# Patient Record
Sex: Male | Born: 1960 | Race: Black or African American | Hispanic: No | Marital: Married | Smoking: Never smoker
Health system: Southern US, Community
[De-identification: ages and names within clinical notes are randomized; demographics above are authoritative.]

## PROBLEM LIST (undated history)

## (undated) DIAGNOSIS — E785 Hyperlipidemia, unspecified: Secondary | ICD-10-CM

## (undated) DIAGNOSIS — K219 Gastro-esophageal reflux disease without esophagitis: Secondary | ICD-10-CM

## (undated) DIAGNOSIS — B1910 Unspecified viral hepatitis B without hepatic coma: Secondary | ICD-10-CM

## (undated) DIAGNOSIS — I10 Essential (primary) hypertension: Secondary | ICD-10-CM

## (undated) HISTORY — DX: Unspecified viral hepatitis B without hepatic coma: B19.10

---

## 1998-09-19 ENCOUNTER — Ambulatory Visit (HOSPITAL_COMMUNITY): Admission: RE | Admit: 1998-09-19 | Discharge: 1998-09-19 | Payer: Self-pay | Admitting: Internal Medicine

## 1998-09-19 ENCOUNTER — Encounter: Payer: Self-pay | Admitting: Internal Medicine

## 2005-02-22 ENCOUNTER — Encounter: Admission: RE | Admit: 2005-02-22 | Discharge: 2005-02-22 | Payer: Self-pay | Admitting: Occupational Medicine

## 2006-04-09 ENCOUNTER — Emergency Department (HOSPITAL_COMMUNITY): Admission: EM | Admit: 2006-04-09 | Discharge: 2006-04-09 | Payer: Self-pay | Admitting: Emergency Medicine

## 2007-09-15 ENCOUNTER — Encounter: Admission: RE | Admit: 2007-09-15 | Discharge: 2007-09-15 | Payer: Self-pay | Admitting: Internal Medicine

## 2009-04-29 ENCOUNTER — Emergency Department (HOSPITAL_COMMUNITY): Admission: EM | Admit: 2009-04-29 | Discharge: 2009-04-29 | Payer: Self-pay | Admitting: Family Medicine

## 2009-07-14 ENCOUNTER — Ambulatory Visit: Payer: Self-pay | Admitting: Internal Medicine

## 2009-07-28 ENCOUNTER — Encounter (INDEPENDENT_AMBULATORY_CARE_PROVIDER_SITE_OTHER): Payer: Self-pay | Admitting: Family Medicine

## 2009-07-28 ENCOUNTER — Ambulatory Visit: Payer: Self-pay | Admitting: Internal Medicine

## 2009-07-28 LAB — CONVERTED CEMR LAB
ALT: 23 units/L (ref 0–53)
AST: 23 units/L (ref 0–37)
Albumin: 4.5 g/dL (ref 3.5–5.2)
Alkaline Phosphatase: 78 units/L (ref 39–117)
BUN: 11 mg/dL (ref 6–23)
Basophils Absolute: 0 10*3/uL (ref 0.0–0.1)
Basophils Relative: 1 % (ref 0–1)
CO2: 24 meq/L (ref 19–32)
Calcium: 9.7 mg/dL (ref 8.4–10.5)
Chloride: 102 meq/L (ref 96–112)
Cholesterol: 233 mg/dL — ABNORMAL HIGH (ref 0–200)
Creatinine, Ser: 1 mg/dL (ref 0.40–1.50)
Eosinophils Absolute: 0 10*3/uL (ref 0.0–0.7)
Eosinophils Relative: 1 % (ref 0–5)
Glucose, Bld: 84 mg/dL (ref 70–99)
HCT: 47.5 % (ref 39.0–52.0)
HDL: 81 mg/dL (ref >39)
Hemoglobin: 15.3 g/dL (ref 13.0–17.0)
LDL Cholesterol: 136 mg/dL — ABNORMAL HIGH (ref 0–99)
Lymphocytes Relative: 36 % (ref 12–46)
Lymphs Abs: 1.1 10*3/uL (ref 0.7–4.0)
MCHC: 32.2 g/dL (ref 30.0–36.0)
MCV: 91.5 fL (ref 78.0–100.0)
Monocytes Absolute: 0.4 10*3/uL (ref 0.1–1.0)
Monocytes Relative: 11 % (ref 3–12)
Neutro Abs: 1.6 10*3/uL — ABNORMAL LOW (ref 1.7–7.7)
Neutrophils Relative %: 51 % (ref 43–77)
Platelets: 292 10*3/uL (ref 150–400)
Potassium: 4.2 meq/L (ref 3.5–5.3)
RBC: 5.19 M/uL (ref 4.22–5.81)
RDW: 12.5 % (ref 11.5–15.5)
Sodium: 140 meq/L (ref 135–145)
Total Bilirubin: 0.4 mg/dL (ref 0.3–1.2)
Total CHOL/HDL Ratio: 2.9
Total Protein: 7.8 g/dL (ref 6.0–8.3)
Triglycerides: 79 mg/dL (ref <150)
VLDL: 16 mg/dL (ref 0–40)
Vit D, 25-Hydroxy: 15 ng/mL — ABNORMAL LOW (ref 30–89)
WBC: 3.2 10*3/uL — ABNORMAL LOW (ref 4.0–10.5)

## 2009-08-04 ENCOUNTER — Ambulatory Visit: Payer: Self-pay | Admitting: Internal Medicine

## 2009-09-06 ENCOUNTER — Ambulatory Visit: Payer: Self-pay | Admitting: Family Medicine

## 2009-09-06 ENCOUNTER — Encounter (INDEPENDENT_AMBULATORY_CARE_PROVIDER_SITE_OTHER): Payer: Self-pay | Admitting: Family Medicine

## 2009-09-06 LAB — CONVERTED CEMR LAB
Anti Nuclear Antibody(ANA): NEGATIVE
Basophils Absolute: 0 10*3/uL (ref 0.0–0.1)
Basophils Relative: 0 % (ref 0–1)
CRP: 0.1 mg/dL (ref <0.6)
Eosinophils Absolute: 0.1 10*3/uL (ref 0.0–0.7)
Eosinophils Relative: 1 % (ref 0–5)
HCT: 46.5 % (ref 39.0–52.0)
Hemoglobin: 15.4 g/dL (ref 13.0–17.0)
Lymphocytes Relative: 28 % (ref 12–46)
Lymphs Abs: 1 10*3/uL (ref 0.7–4.0)
MCHC: 33.1 g/dL (ref 30.0–36.0)
MCV: 87.9 fL (ref 78.0–100.0)
Monocytes Absolute: 0.3 10*3/uL (ref 0.1–1.0)
Monocytes Relative: 9 % (ref 3–12)
Neutro Abs: 2.2 10*3/uL (ref 1.7–7.7)
Neutrophils Relative %: 61 % (ref 43–77)
Platelets: 172 10*3/uL (ref 150–400)
RBC: 5.29 M/uL (ref 4.22–5.81)
RDW: 12.6 % (ref 11.5–15.5)
Rheumatoid fact SerPl-aCnc: <20 intl units/mL (ref 0–20)
Sed Rate: 4 mm/hr (ref 0–16)
WBC: 3.6 10*3/uL — ABNORMAL LOW (ref 4.0–10.5)

## 2009-09-14 ENCOUNTER — Encounter: Admission: RE | Admit: 2009-09-14 | Discharge: 2009-10-11 | Payer: Self-pay | Admitting: Family Medicine

## 2009-09-18 ENCOUNTER — Emergency Department (HOSPITAL_COMMUNITY): Admission: EM | Admit: 2009-09-18 | Discharge: 2009-09-18 | Payer: Self-pay | Admitting: Emergency Medicine

## 2009-11-09 ENCOUNTER — Emergency Department (HOSPITAL_COMMUNITY): Admission: EM | Admit: 2009-11-09 | Discharge: 2009-11-09 | Payer: Self-pay | Admitting: Emergency Medicine

## 2010-04-13 ENCOUNTER — Emergency Department (HOSPITAL_COMMUNITY)
Admission: EM | Admit: 2010-04-13 | Discharge: 2010-04-13 | Payer: Self-pay | Source: Home / Self Care | Admitting: Emergency Medicine

## 2010-08-01 LAB — POCT CARDIAC MARKERS
CKMB, poc: <1 ng/mL — ABNORMAL LOW (ref 1.0–8.0)
Myoglobin, poc: 46.3 ng/mL (ref 12–200)
Troponin i, poc: <0.05 ng/mL (ref 0.00–0.09)

## 2010-08-06 LAB — POCT CARDIAC MARKERS
CKMB, poc: <1 ng/mL — ABNORMAL LOW (ref 1.0–8.0)
Myoglobin, poc: 60.8 ng/mL (ref 12–200)
Troponin i, poc: <0.05 ng/mL (ref 0.00–0.09)

## 2010-08-07 ENCOUNTER — Emergency Department (HOSPITAL_COMMUNITY)
Admission: EM | Admit: 2010-08-07 | Discharge: 2010-08-07 | Disposition: A | Discharge status: Home / Self Care | Payer: Self-pay | Attending: Emergency Medicine | Admitting: Emergency Medicine

## 2010-08-07 DIAGNOSIS — M549 Dorsalgia, unspecified: Secondary | ICD-10-CM | POA: Insufficient documentation

## 2010-08-08 LAB — CBC
HCT: 46.3 % (ref 39.0–52.0)
Hemoglobin: 15.5 g/dL (ref 13.0–17.0)
MCHC: 33.4 g/dL (ref 30.0–36.0)
MCV: 89.5 fL (ref 78.0–100.0)
Platelets: 264 10*3/uL (ref 150–400)
RBC: 5.18 MIL/uL (ref 4.22–5.81)
RDW: 13.5 % (ref 11.5–15.5)
WBC: 3.6 10*3/uL — ABNORMAL LOW (ref 4.0–10.5)

## 2010-08-08 LAB — DIFFERENTIAL
Basophils Absolute: 0 10*3/uL (ref 0.0–0.1)
Basophils Relative: 0 % (ref 0–1)
Eosinophils Absolute: 0.1 10*3/uL (ref 0.0–0.7)
Eosinophils Relative: 2 % (ref 0–5)
Lymphocytes Relative: 26 % (ref 12–46)
Lymphs Abs: 0.9 10*3/uL (ref 0.7–4.0)
Monocytes Absolute: 0.4 10*3/uL (ref 0.1–1.0)
Monocytes Relative: 10 % (ref 3–12)
Neutro Abs: 2.2 10*3/uL (ref 1.7–7.7)
Neutrophils Relative %: 62 % (ref 43–77)

## 2010-08-08 LAB — POCT I-STAT, CHEM 8
BUN: 10 mg/dL (ref 6–23)
Calcium, Ion: 1.18 mmol/L (ref 1.12–1.32)
Chloride: 104 mEq/L (ref 96–112)
Creatinine, Ser: 1 mg/dL (ref 0.4–1.5)
Glucose, Bld: 94 mg/dL (ref 70–99)
HCT: 50 % (ref 39.0–52.0)
Hemoglobin: 17 g/dL (ref 13.0–17.0)
Potassium: 4 mEq/L (ref 3.5–5.1)
Sodium: 140 mEq/L (ref 135–145)
TCO2: 29 mmol/L (ref 0–100)

## 2010-08-08 LAB — URINALYSIS, ROUTINE W REFLEX MICROSCOPIC
Bilirubin Urine: NEGATIVE
Glucose, UA: NEGATIVE mg/dL
Hgb urine dipstick: NEGATIVE
Ketones, ur: NEGATIVE mg/dL
Nitrite: NEGATIVE
Protein, ur: NEGATIVE mg/dL
Specific Gravity, Urine: 1.012 (ref 1.005–1.030)
Urobilinogen, UA: 0.2 mg/dL (ref 0.0–1.0)
pH: 6 (ref 5.0–8.0)

## 2010-08-22 LAB — POCT H PYLORI SCREEN: H. PYLORI SCREEN, POC: NEGATIVE

## 2013-02-16 ENCOUNTER — Emergency Department (HOSPITAL_COMMUNITY)
Admission: EM | Admit: 2013-02-16 | Discharge: 2013-02-16 | Discharge status: Against Medical Advice | Payer: Self-pay | Attending: Emergency Medicine | Admitting: Emergency Medicine

## 2013-02-16 ENCOUNTER — Encounter (HOSPITAL_COMMUNITY): Payer: Self-pay

## 2013-02-16 DIAGNOSIS — M549 Dorsalgia, unspecified: Secondary | ICD-10-CM | POA: Insufficient documentation

## 2013-02-16 HISTORY — DX: Hyperlipidemia, unspecified: E78.5

## 2013-02-16 LAB — BASIC METABOLIC PANEL
BUN: 15 mg/dL (ref 6–23)
CO2: 24 mEq/L (ref 19–32)
Calcium: 9.5 mg/dL (ref 8.4–10.5)
Chloride: 101 mEq/L (ref 96–112)
Creatinine, Ser: 1.03 mg/dL (ref 0.50–1.35)
GFR calc Af Amer: >90 mL/min (ref >90)
GFR calc non Af Amer: 82 mL/min — ABNORMAL LOW (ref >90)
Glucose, Bld: 92 mg/dL (ref 70–99)
Potassium: 3.7 mEq/L (ref 3.5–5.1)
Sodium: 135 mEq/L (ref 135–145)

## 2013-02-16 LAB — CBC
HCT: 42.7 % (ref 39.0–52.0)
Hemoglobin: 14.9 g/dL (ref 13.0–17.0)
MCH: 29.3 pg (ref 26.0–34.0)
MCHC: 34.9 g/dL (ref 30.0–36.0)
MCV: 84.1 fL (ref 78.0–100.0)
Platelets: 256 10*3/uL (ref 150–400)
RBC: 5.08 MIL/uL (ref 4.22–5.81)
RDW: 12.3 % (ref 11.5–15.5)
WBC: 3.9 10*3/uL — ABNORMAL LOW (ref 4.0–10.5)

## 2013-02-16 LAB — POCT I-STAT TROPONIN I: Troponin i, poc: 0 ng/mL (ref 0.00–0.08)

## 2013-02-16 NOTE — ED Notes (Signed)
Second attempt to call for pt. No response, unable to find pt.
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
## 2013-02-16 NOTE — ED Notes (Signed)
3rd time charge called for pt. No response

## 2013-02-16 NOTE — ED Notes (Signed)
Pt called to go back to a room. No response or pt found in lobby.

## 2013-09-11 ENCOUNTER — Emergency Department (HOSPITAL_COMMUNITY): Payer: No Typology Code available for payment source

## 2013-09-11 ENCOUNTER — Emergency Department (HOSPITAL_COMMUNITY)
Admission: EM | Admit: 2013-09-11 | Discharge: 2013-09-11 | Disposition: A | Discharge status: Home / Self Care | Payer: No Typology Code available for payment source | Attending: Emergency Medicine | Admitting: Emergency Medicine

## 2013-09-11 ENCOUNTER — Encounter (HOSPITAL_COMMUNITY): Payer: Self-pay | Admitting: Emergency Medicine

## 2013-09-11 ENCOUNTER — Other Ambulatory Visit: Payer: Self-pay

## 2013-09-11 DIAGNOSIS — I10 Essential (primary) hypertension: Secondary | ICD-10-CM

## 2013-09-11 DIAGNOSIS — R072 Precordial pain: Secondary | ICD-10-CM | POA: Insufficient documentation

## 2013-09-11 DIAGNOSIS — K219 Gastro-esophageal reflux disease without esophagitis: Secondary | ICD-10-CM | POA: Insufficient documentation

## 2013-09-11 DIAGNOSIS — Z79899 Other long term (current) drug therapy: Secondary | ICD-10-CM | POA: Insufficient documentation

## 2013-09-11 DIAGNOSIS — R079 Chest pain, unspecified: Secondary | ICD-10-CM

## 2013-09-11 DIAGNOSIS — E785 Hyperlipidemia, unspecified: Secondary | ICD-10-CM | POA: Insufficient documentation

## 2013-09-11 LAB — CBC
HCT: 51.6 % (ref 39.0–52.0)
Hemoglobin: 18.6 g/dL — ABNORMAL HIGH (ref 13.0–17.0)
MCH: 31.2 pg (ref 26.0–34.0)
MCHC: 36 g/dL (ref 30.0–36.0)
MCV: 86.4 fL (ref 78.0–100.0)
PLATELETS: 263 10*3/uL (ref 150–400)
RBC: 5.97 MIL/uL — AB (ref 4.22–5.81)
RDW: 12.4 % (ref 11.5–15.5)
WBC: 9.1 10*3/uL (ref 4.0–10.5)

## 2013-09-11 LAB — I-STAT TROPONIN, ED
Troponin i, poc: 0 ng/mL (ref 0.00–0.08)
Troponin i, poc: 0 ng/mL (ref 0.00–0.08)

## 2013-09-11 LAB — COMPREHENSIVE METABOLIC PANEL
ALBUMIN: 4.2 g/dL (ref 3.5–5.2)
ALT: 53 U/L (ref 0–53)
AST: 26 U/L (ref 0–37)
Alkaline Phosphatase: 89 U/L (ref 39–117)
BUN: 16 mg/dL (ref 6–23)
CALCIUM: 10 mg/dL (ref 8.4–10.5)
CO2: 24 meq/L (ref 19–32)
Chloride: 97 mEq/L (ref 96–112)
Creatinine, Ser: 0.88 mg/dL (ref 0.50–1.35)
GFR calc Af Amer: >90 mL/min (ref >90)
Glucose, Bld: 81 mg/dL (ref 70–99)
Potassium: 4.5 mEq/L (ref 3.7–5.3)
SODIUM: 138 meq/L (ref 137–147)
Total Bilirubin: 0.5 mg/dL (ref 0.3–1.2)
Total Protein: 8.6 g/dL — ABNORMAL HIGH (ref 6.0–8.3)

## 2013-09-11 LAB — LIPASE, BLOOD: LIPASE: 23 U/L (ref 11–59)

## 2013-09-11 MED ORDER — HYDROCHLOROTHIAZIDE 25 MG PO TABS
25.0000 mg | ORAL_TABLET | Freq: Every day | ORAL | Status: DC
  Administered 2013-09-11: 25 mg via ORAL
  Filled 2013-09-11: qty 1

## 2013-09-11 MED ORDER — FAMOTIDINE 20 MG PO TABS
20.0000 mg | ORAL_TABLET | Freq: Once | ORAL | Status: AC
  Administered 2013-09-11: 20 mg via ORAL
  Filled 2013-09-11: qty 1

## 2013-09-11 MED ORDER — LISINOPRIL 10 MG PO TABS
10.0000 mg | ORAL_TABLET | Freq: Once | ORAL | Status: AC
  Administered 2013-09-11: 10 mg via ORAL
  Filled 2013-09-11: qty 1

## 2013-09-11 MED ORDER — LISINOPRIL 10 MG PO TABS: 20.0000 mg | ORAL_TABLET | Freq: Every day | ORAL | Status: AC

## 2013-09-11 MED ORDER — HYDROCHLOROTHIAZIDE 25 MG PO TABS: 25.0000 mg | ORAL_TABLET | Freq: Every day | ORAL | Status: AC

## 2013-09-11 NOTE — ED Notes (Signed)
Per EMS: Pt from PCP with c/o "sharp" substernal CP radiating to both sides of neck, starting at 1100 while watching TV. Denies any SOB, N/V/diaphoresis. Pain lasted appx 1 hour. Pt given 325 aspirin. Pt denies pain at this time. Pt noted to be hypertensive 204/102, denies hx of HTN. AO x4. NSR. 98% RA.

## 2013-09-11 NOTE — ED Notes (Signed)
PA at the bedside.

## 2013-09-11 NOTE — ED Notes (Signed)
Patient returned from X-ray 

## 2013-09-11 NOTE — Discharge Instructions (Signed)
Arterial Hypertension °Arterial hypertension (high blood pressure) is a condition of elevated pressure in your blood vessels. Hypertension over a long period of time is a risk factor for strokes, heart attacks, and heart failure. It is also the leading cause of kidney (renal) failure.  °CAUSES  °· In Adults -- Over 90% of all hypertension has no known cause. This is called essential or primary hypertension. In the other 10% of people with hypertension, the increase in blood pressure is caused by another disorder. This is called secondary hypertension. Important causes of secondary hypertension are: °· Heavy alcohol use. °· Obstructive sleep apnea. °· Hyperaldosterosim (Conn's syndrome). °· Steroid use. °· Chronic kidney failure. °· Hyperparathyroidism. °· Medications. °· Renal artery stenosis. °· Pheochromocytoma. °· Cushing's disease. °· Coarctation of the aorta. °· Scleroderma renal crisis. °· Licorice (in excessive amounts). °· Drugs (cocaine, methamphetamine). °Your caregiver can explain any items above that apply to you. °· In Children -- Secondary hypertension is more common and should always be considered. °· Pregnancy -- Few women of childbearing age have high blood pressure. However, up to 10% of them develop hypertension of pregnancy. Generally, this will not harm the woman. It may be a sign of 3 complications of pregnancy: preeclampsia, HELLP syndrome, and eclampsia. Follow up and control with medication is necessary. °SYMPTOMS  °· This condition normally does not produce any noticeable symptoms. It is usually found during a routine exam. °· Malignant hypertension is a late problem of high blood pressure. It may have the following symptoms: °· Headaches. °· Blurred vision. °· End-organ damage (this means your kidneys, heart, lungs, and other organs are being damaged). °· Stressful situations can increase the blood pressure. If a person with normal blood pressure has their blood pressure go up while being  seen by their caregiver, this is often termed "white coat hypertension." Its importance is not known. It may be related with eventually developing hypertension or complications of hypertension. °· Hypertension is often confused with mental tension, stress, and anxiety. °DIAGNOSIS  °The diagnosis is made by 3 separate blood pressure measurements. They are taken at least 1 week apart from each other. If there is organ damage from hypertension, the diagnosis may be made without repeat measurements. °Hypertension is usually identified by having blood pressure readings: °· Above 140/90 mmHg measured in both arms, at 3 separate times, over a couple weeks. °· Over 130/80 mmHg should be considered a risk factor and may require treatment in patients with diabetes. °Blood pressure readings over 120/80 mmHg are called "pre-hypertension" even in non-diabetic patients. °To get a true blood pressure measurement, use the following guidelines. Be aware of the factors that can alter blood pressure readings. °· Take measurements at least 1 hour after caffeine. °· Take measurements 30 minutes after smoking and without any stress. This is another reason to quit smoking  it raises your blood pressure. °· Use a proper cuff size. Ask your caregiver if you are not sure about your cuff size. °· Most home blood pressure cuffs are automatic. They will measure systolic and diastolic pressures. The systolic pressure is the pressure reading at the start of sounds. Diastolic pressure is the pressure at which the sounds disappear. If you are elderly, measure pressures in multiple postures. Try sitting, lying or standing. °· Sit at rest for a minimum of 5 minutes before taking measurements. °· You should not be on any medications like decongestants. These are found in many cold medications. °· Record your blood pressure readings and review   them with your caregiver. °If you have hypertension: °· Your caregiver may do tests to be sure you do not have  secondary hypertension (see "causes" above). °· Your caregiver may also look for signs of metabolic syndrome. This is also called Syndrome X or Insulin Resistance Syndrome. You may have this syndrome if you have type 2 diabetes, abdominal obesity, and abnormal blood lipids in addition to hypertension. °· Your caregiver will take your medical and family history and perform a physical exam. °· Diagnostic tests may include blood tests (for glucose, cholesterol, potassium, and kidney function), a urinalysis, or an EKG. Other tests may also be necessary depending on your condition. °PREVENTION  °There are important lifestyle issues that you can adopt to reduce your chance of developing hypertension: °· Maintain a normal weight. °· Limit the amount of salt (sodium) in your diet. °· Exercise often. °· Limit alcohol intake. °· Get enough potassium in your diet. Discuss specific advice with your caregiver. °· Follow a DASH diet (dietary approaches to stop hypertension). This diet is rich in fruits, vegetables, and low-fat dairy products, and avoids certain fats. °PROGNOSIS  °Essential hypertension cannot be cured. Lifestyle changes and medical treatment can lower blood pressure and reduce complications. The prognosis of secondary hypertension depends on the underlying cause. Many people whose hypertension is controlled with medicine or lifestyle changes can live a normal, healthy life.  °RISKS AND COMPLICATIONS  °While high blood pressure alone is not an illness, it often requires treatment due to its short- and long-term effects on many organs. Hypertension increases your risk for: °· CVAs or strokes (cerebrovascular accident). °· Heart failure due to chronically high blood pressure (hypertensive cardiomyopathy). °· Heart attack (myocardial infarction). °· Damage to the retina (hypertensive retinopathy). °· Kidney failure (hypertensive nephropathy). °Your caregiver can explain list items above that apply to you. Treatment  of hypertension can significantly reduce the risk of complications. °TREATMENT  °· For overweight patients, weight loss and regular exercise are recommended. Physical fitness lowers blood pressure. °· Mild hypertension is usually treated with diet and exercise. A diet rich in fruits and vegetables, fat-free dairy products, and foods low in fat and salt (sodium) can help lower blood pressure. Decreasing salt intake decreases blood pressure in a 1/3 of people. °· Stop smoking if you are a smoker. °The steps above are highly effective in reducing blood pressure. While these actions are easy to suggest, they are difficult to achieve. Most patients with moderate or severe hypertension end up requiring medications to bring their blood pressure down to a normal level. There are several classes of medications for treatment. Blood pressure pills (antihypertensives) will lower blood pressure by their different actions. Lowering the blood pressure by 10 mmHg may decrease the risk of complications by as much as 25%. °The goal of treatment is effective blood pressure control. This will reduce your risk for complications. Your caregiver will help you determine the best treatment for you according to your lifestyle. What is excellent treatment for one person, may not be for you. °HOME CARE INSTRUCTIONS  °· Do not smoke. °· Follow the lifestyle changes outlined in the "Prevention" section. °· If you are on medications, follow the directions carefully. Blood pressure medications must be taken as prescribed. Skipping doses reduces their benefit. It also puts you at risk for problems. °· Follow up with your caregiver, as directed. °· If you are asked to monitor your blood pressure at home, follow the guidelines in the "Diagnosis" section above. °SEEK MEDICAL CARE   IF:   You think you are having medication side effects.  You have recurrent headaches or lightheadedness.  You have swelling in your ankles.  You have trouble with  your vision. SEEK IMMEDIATE MEDICAL CARE IF:   You have sudden onset of chest pain or pressure, difficulty breathing, or other symptoms of a heart attack.  You have a severe headache.  You have symptoms of a stroke (such as sudden weakness, difficulty speaking, difficulty walking). MAKE SURE YOU:   Understand these instructions.  Will watch your condition.  Will get help right away if you are not doing well or get worse. Document Released: 05/07/2005 Document Revised: 07/30/2011 Document Reviewed: 12/05/2006 [facility] Patient Information 2014 [place], [place].  Chest Pain (Nonspecific) It is often hard to give a specific diagnosis for the cause of chest pain. There is always a chance that your pain could be related to something serious, such as a heart attack or a blood clot in the lungs. You need to follow up with your caregiver for further evaluation. CAUSES   Heartburn.  Pneumonia or bronchitis.  Anxiety or stress.  Inflammation around your heart (pericarditis) or lung (pleuritis or pleurisy).  A blood clot in the lung.  A collapsed lung (pneumothorax). It can develop suddenly on its own (spontaneous pneumothorax) or from injury (trauma) to the chest.  Shingles infection (herpes zoster virus). The chest wall is composed of bones, muscles, and cartilage. Any of these can be the source of the pain.  The bones can be bruised by injury.  The muscles or cartilage can be strained by coughing or overwork.  The cartilage can be affected by inflammation and become sore (costochondritis). DIAGNOSIS  Lab tests or other studies, such as X-rays, electrocardiography, stress testing, or cardiac imaging, may be needed to find the cause of your pain.  TREATMENT   Treatment depends on what may be causing your chest pain. Treatment may include:  Acid blockers for heartburn.  Anti-inflammatory medicine.  Pain medicine for inflammatory conditions.  Antibiotics if an infection is  present.  You may be advised to change lifestyle habits. This includes stopping smoking and avoiding alcohol, caffeine, and chocolate.  You may be advised to keep your head raised (elevated) when sleeping. This reduces the chance of acid going backward from your stomach into your esophagus.  Most of the time, nonspecific chest pain will improve within 2 to 3 days with rest and mild pain medicine. HOME CARE INSTRUCTIONS   If antibiotics were prescribed, take your antibiotics as directed. Finish them even if you start to feel better.  For the next few days, avoid physical activities that bring on chest pain. Continue physical activities as directed.  Do not smoke.  Avoid drinking alcohol.  Only take over-the-counter or prescription medicine for pain, discomfort, or fever as directed by your caregiver.  Follow your caregiver's suggestions for further testing if your chest pain does not go away.  Keep any follow-up appointments you made. If you do not go to an appointment, you could develop lasting (chronic) problems with pain. If there is any problem keeping an appointment, you must call to reschedule. SEEK MEDICAL CARE IF:   You think you are having problems from the medicine you are taking. Read your medicine instructions carefully.  Your chest pain does not go away, even after treatment.  You develop a rash with blisters on your chest. SEEK IMMEDIATE MEDICAL CARE IF:   You have increased chest pain or pain that spreads to your  arm, neck, jaw, back, or abdomen.  You develop shortness of breath, an increasing cough, or you are coughing up blood.  You have severe back or abdominal pain, feel nauseous, or vomit.  You develop severe weakness, fainting, or chills.  You have a fever. THIS IS AN EMERGENCY. Do not wait to see if the pain will go away. Get medical help at once. Call your local emergency services (911 in [place] Do not drive yourself to the hospital. MAKE SURE YOU:    Understand these instructions.  Will watch your condition.  Will get help right away if you are not doing well or get worse. Document Released: 02/14/2005 Document Revised: 07/30/2011 Document Reviewed: 12/11/2007 [facility]ExitCare Patient Information 2014 [place], [place].

## 2013-09-11 NOTE — ED Provider Notes (Signed)
CSN: [id number]     Arrival date & time 09/11/13  1346 History   First MD Initiated Contact with Patient 09/11/13 1350     Chief Complaint  Patient presents with  . Chest Pain     (Consider location/radiation/quality/duration/timing/severity/associated sxs/prior Treatment) HPI Comments: William Watson is a 53 y.o. Male presenting with midsternal chest and mid upper abdominal pain with radiation into his bilateral neck and started about 3 hours before arrival.  He was sitting watching television when the episode started and lasted about 1 hour.  He reports drinking a glass of water and the radiating pain improved,  But he still has the mid sternal and epigastric discomfort.  He reports having this sharp pain infrequently,  Approximately every 3-6 months which resolves without intervention and has been a problem for at least several years.  He denies nausea, vomiting, , diaphoresis or shortness of breath with these episodes.  He denies a history of hypertension although it is elevated during this visit today.  Additionally denies headache or visual changes.  He last ate yesterday afternoon, generally eats one meal per day.  He does have a history of acid reflux disease.  He was given aspirin 325 mg per ems prior to arrival here.  [place] [facility] and [facility]  Pt does not know the reason for this referral.     The history is provided by the patient.    Past Medical History  Diagnosis Date  . Hyperlipidemia    History reviewed. No pertinent past surgical history. History reviewed. No pertinent family history. History  Substance Use Topics  . Smoking status: Never Smoker   . Smokeless tobacco: Never Used  . Alcohol Use: No    Review of Systems  Constitutional: Negative for fever.  HENT: Negative for congestion and sore throat.   Eyes: Negative.   Respiratory: Negative for cough, chest tightness and shortness of  breath.   Cardiovascular: Positive for chest pain. Negative for palpitations and leg swelling.  Gastrointestinal: Negative for nausea, vomiting, abdominal pain and diarrhea.  Genitourinary: Negative.   Musculoskeletal: Negative for arthralgias and joint swelling.  Skin: Negative.  Negative for rash and wound.  Neurological: Negative for dizziness, weakness, light-headedness, numbness and headaches.  Psychiatric/Behavioral: Negative.       Allergies  Review of patient's allergies indicates no known allergies.  Home Medications   Prior to Admission medications   Medication Sig Start Date End Date Taking? Authorizing Provider  naproxen sodium (ANAPROX) 220 MG tablet Take 220 mg by mouth 2 (two) times daily as needed (pain).    Historical Provider, MD  ranitidine (ZANTAC) 150 MG tablet Take 150 mg by mouth 2 (two) times daily as needed for heartburn.    Historical Provider, MD   BP 163/70  Pulse 86  Temp(Src) 98.1 F (36.7 C) (Oral)  Resp 20  SpO2 99% Physical Exam  Nursing note and vitals reviewed. Constitutional: He appears well-developed and well-nourished.  hypertensive  HENT:  Head: Normocephalic and atraumatic.  Eyes: Conjunctivae are normal.  Neck: Normal range of motion. Neck supple.  Cardiovascular: Normal rate, regular rhythm, normal heart sounds and intact distal pulses.   Pulmonary/Chest: Effort normal and breath sounds normal. He has no wheezes. He exhibits no tenderness.  Abdominal: Soft. Bowel sounds are normal. There is tenderness in the epigastric area. There is no rigidity, no guarding and negative Murphy's sign.  Musculoskeletal: Normal range of motion.  Neurological: He is alert.  Skin: Skin is warm and dry.  Psychiatric: He has a normal mood and affect.    ED Course  Procedures (including critical care time) Labs Review Labs Reviewed  CBC - Abnormal; Notable for the following:    RBC 5.97 (*)    Hemoglobin 18.6 (*)    All other components within  normal limits  COMPREHENSIVE METABOLIC PANEL - Abnormal; Notable for the following:    Total Protein 8.6 (*)    All other components within normal limits  LIPASE, BLOOD  I-STAT TROPOININ, ED  I-STAT TROPOININ, ED    Imaging Review Dg Chest 2 View  09/11/2013   CLINICAL DATA:  Chest pain  EXAM: CHEST  2 VIEW  COMPARISON:  09/07/2013  FINDINGS: The heart size and mediastinal contours are within normal limits. Both lungs are clear. The visualized skeletal structures are unremarkable.  IMPRESSION: No active cardiopulmonary disease.   Electronically Signed   By: Marlan Palauharles  Clark M.D.   On: 09/11/2013 14:54     EKG Interpretation   Date/Time:  Friday September 11 2013 13:41:41 EDT Ventricular Rate:  69 PR Interval:  134 QRS Duration: 80 QT Interval:  378 QTC Calculation: 405 R Axis:   36 Text Interpretation:  Sinus rhythm Consider left ventricular hypertrophy  ST elev, probable normal early repol pattern No significant change since  last tracing Confirmed by YAO  MD, DAVID ([id number]) on 09/11/2013 2:09:19 PM      MDM   Final diagnoses:  Chest pain  Hypertension    Pt is pain free at time of dc.  He was given lisinopril and hctz with adequate reduction in his bp.  He was given a pepcid tablet and obtained complete relief of his sx.  He has had 2 negative troponins while here,  No acute changes on his ekg.  He was encouraged to keep his appt with the cardiologist as referred by his pcp on May 6.  He agrees to do this,  Returning here or calling his pcp in the interim for any return of sx.    Will prescribe lisinopril and hctz for home use.  He agrees to this tx plan.  Patients labs and/or radiological studies were viewed and considered during the medical decision making and disposition process.  The patient appears reasonably screened and/or stabilized for discharge and I doubt any other medical condition or other [facility] requiring further screening, evaluation, or treatment in the ED at this  time prior to discharge.  Pt was discussed with Dr. Silverio LayYao prior to dc home.    Burgess AmorJulie Autumn Gunn, PA-C 09/11/13 1919

## 2013-09-11 NOTE — ED Notes (Signed)
PA Raynelle FanningJulie at the bedside.

## 2013-09-11 NOTE — ED Notes (Signed)
Phlebotomy at the bedside  

## 2013-09-13 NOTE — ED Provider Notes (Signed)
Medical screening examination/treatment/procedure(s) were performed by non-physician practitioner and as supervising physician I was immediately available for consultation/collaboration.   EKG Interpretation   Date/Time:  Friday September 11 2013 13:41:41 EDT Ventricular Rate:  69 PR Interval:  134 QRS Duration: 80 QT Interval:  378 QTC Calculation: 405 R Axis:   36 Text Interpretation:  Sinus rhythm Consider left ventricular hypertrophy  ST elev, probable normal early repol pattern No significant change since  last tracing Confirmed by Willette Mudry  MD, Crystalle Popwell ([id number]) on 09/11/2013 2:09:19 PM        Richardean Canalavid H Ellyse Rotolo, MD 09/13/13 1315

## 2014-06-28 ENCOUNTER — Emergency Department (HOSPITAL_COMMUNITY)
Admission: EM | Admit: 2014-06-28 | Discharge: 2014-06-28 | Disposition: A | Discharge status: Home / Self Care | Payer: 59 | Attending: Emergency Medicine | Admitting: Emergency Medicine

## 2014-06-28 ENCOUNTER — Encounter (HOSPITAL_COMMUNITY): Payer: Self-pay | Admitting: *Deleted

## 2014-06-28 ENCOUNTER — Emergency Department (HOSPITAL_COMMUNITY): Payer: 59

## 2014-06-28 DIAGNOSIS — R079 Chest pain, unspecified: Secondary | ICD-10-CM

## 2014-06-28 DIAGNOSIS — Z7952 Long term (current) use of systemic steroids: Secondary | ICD-10-CM | POA: Diagnosis not present

## 2014-06-28 DIAGNOSIS — Z8639 Personal history of other endocrine, nutritional and metabolic disease: Secondary | ICD-10-CM | POA: Insufficient documentation

## 2014-06-28 DIAGNOSIS — Z79899 Other long term (current) drug therapy: Secondary | ICD-10-CM | POA: Diagnosis not present

## 2014-06-28 DIAGNOSIS — I10 Essential (primary) hypertension: Secondary | ICD-10-CM | POA: Diagnosis not present

## 2014-06-28 DIAGNOSIS — R0602 Shortness of breath: Secondary | ICD-10-CM | POA: Diagnosis present

## 2014-06-28 HISTORY — DX: Essential (primary) hypertension: I10

## 2014-06-28 LAB — BASIC METABOLIC PANEL
Anion gap: 7 (ref 5–15)
BUN: 18 mg/dL (ref 6–23)
CO2: 28 mmol/L (ref 19–32)
Calcium: 9.2 mg/dL (ref 8.4–10.5)
Chloride: 101 mmol/L (ref 96–112)
Creatinine, Ser: 1.19 mg/dL (ref 0.50–1.35)
GFR calc Af Amer: 79 mL/min — ABNORMAL LOW (ref >90)
GFR, EST NON AFRICAN AMERICAN: 68 mL/min — AB (ref >90)
Glucose, Bld: 111 mg/dL — ABNORMAL HIGH (ref 70–99)
POTASSIUM: 4.6 mmol/L (ref 3.5–5.1)
SODIUM: 136 mmol/L (ref 135–145)

## 2014-06-28 LAB — HEPATIC FUNCTION PANEL
ALBUMIN: 4.4 g/dL (ref 3.5–5.2)
ALK PHOS: 81 U/L (ref 39–117)
ALT: 26 U/L (ref 0–53)
AST: 35 U/L (ref 0–37)
Bilirubin, Direct: 0.2 mg/dL (ref 0.0–0.5)
Indirect Bilirubin: 0.7 mg/dL (ref 0.3–0.9)
Total Bilirubin: 0.9 mg/dL (ref 0.3–1.2)
Total Protein: 8.2 g/dL (ref 6.0–8.3)

## 2014-06-28 LAB — CBC
HCT: 44.7 % (ref 39.0–52.0)
Hemoglobin: 15.1 g/dL (ref 13.0–17.0)
MCH: 30.3 pg (ref 26.0–34.0)
MCHC: 33.8 g/dL (ref 30.0–36.0)
MCV: 89.8 fL (ref 78.0–100.0)
Platelets: 283 10*3/uL (ref 150–400)
RBC: 4.98 MIL/uL (ref 4.22–5.81)
RDW: 11.9 % (ref 11.5–15.5)
WBC: 4.3 10*3/uL (ref 4.0–10.5)

## 2014-06-28 LAB — I-STAT TROPONIN, ED: Troponin i, poc: 0 ng/mL (ref 0.00–0.08)

## 2014-06-28 LAB — LIPASE, BLOOD: Lipase: 30 U/L (ref 11–59)

## 2014-06-28 MED ORDER — GI COCKTAIL ~~LOC~~
30.0000 mL | Freq: Once | ORAL | Status: AC
  Administered 2014-06-28: 30 mL via ORAL
  Filled 2014-06-28: qty 30

## 2014-06-28 NOTE — ED Notes (Signed)
Pt admits to intermittent chronic chest pain, currently denies chest pain at present - states he also experiences intermittent shortness of breath. Pt was recently seen by his PCP and given a new medication for hypertension however pt states he significant other checked his blood pressure and it was still high tonight.

## 2014-06-28 NOTE — Discharge Instructions (Signed)
Please follow up with your primary care physician in 1-2 days. If you do not have one please call the [facility] number listed above. Please continue to take your Nexium. Please read all discharge instructions and return precautions.    Chest Pain (Nonspecific) It is often hard to give a specific diagnosis for the cause of chest pain. There is always a chance that your pain could be related to something serious, such as a heart attack or a blood clot in the lungs. You need to follow up with your health care provider for further evaluation. CAUSES   Heartburn.  Pneumonia or bronchitis.  Anxiety or stress.  Inflammation around your heart (pericarditis) or lung (pleuritis or pleurisy).  A blood clot in the lung.  A collapsed lung (pneumothorax). It can develop suddenly on its own (spontaneous pneumothorax) or from trauma to the chest.  Shingles infection (herpes zoster virus). The chest wall is composed of bones, muscles, and cartilage. Any of these can be the source of the pain.  The bones can be bruised by injury.  The muscles or cartilage can be strained by coughing or overwork.  The cartilage can be affected by inflammation and become sore (costochondritis). DIAGNOSIS  Lab tests or other studies may be needed to find the cause of your pain. Your health care provider may have you take a test called an ambulatory electrocardiogram (ECG). An ECG records your heartbeat patterns over a 24-hour period. You may also have other tests, such as:  Transthoracic echocardiogram (TTE). During echocardiography, sound waves are used to evaluate how blood flows through your heart.  Transesophageal echocardiogram (TEE).  Cardiac monitoring. This allows your health care provider to monitor your heart rate and rhythm in real time.  Holter monitor. This is a portable device that records your heartbeat and can help diagnose heart arrhythmias. It allows your health care provider to  track your heart activity for several days, if needed.  Stress tests by exercise or by giving medicine that makes the heart beat faster. TREATMENT   Treatment depends on what may be causing your chest pain. Treatment may include:  Acid blockers for heartburn.  Anti-inflammatory medicine.  Pain medicine for inflammatory conditions.  Antibiotics if an infection is present.  You may be advised to change lifestyle habits. This includes stopping smoking and avoiding alcohol, caffeine, and chocolate.  You may be advised to keep your head raised (elevated) when sleeping. This reduces the chance of acid going backward from your stomach into your esophagus. Most of the time, nonspecific chest pain will improve within 2-3 days with rest and mild pain medicine.  HOME CARE INSTRUCTIONS   If antibiotics were prescribed, take them as directed. Finish them even if you start to feel better.  For the next few days, avoid physical activities that bring on chest pain. Continue physical activities as directed.  Do not use any tobacco products, including cigarettes, chewing tobacco, or electronic cigarettes.  Avoid drinking alcohol.  Only take medicine as directed by your health care provider.  Follow your health care provider's suggestions for further testing if your chest pain does not go away.  Keep any follow-up appointments you made. If you do not go to an appointment, you could develop lasting (chronic) problems with pain. If there is any problem keeping an appointment, call to reschedule. SEEK MEDICAL CARE IF:   Your chest pain does not go away, even after treatment.  You have a rash with blisters on your  chest.  You have a fever. SEEK IMMEDIATE MEDICAL CARE IF:   You have increased chest pain or pain that spreads to your arm, neck, jaw, back, or abdomen.  You have shortness of breath.  You have an increasing cough, or you cough up blood.  You have severe back or abdominal  pain.  You feel nauseous or vomit.  You have severe weakness.  You faint.  You have chills. This is an emergency. Do not wait to see if the pain will go away. Get medical help at once. Call your local emergency services (911 in [place] Do not drive yourself to the hospital. MAKE SURE YOU:   Understand these instructions.  Will watch your condition.  Will get help right away if you are not doing well or get worse. Document Released: 02/14/2005 Document Revised: 05/12/2013 Document Reviewed: 12/11/2007 [facility] Patient Information 2015 [place], [place]. This information is not intended to replace advice given to you by your health care provider. Make sure you discuss any questions you have with your health care provider. Food Choices for Gastroesophageal Reflux Disease When you have gastroesophageal reflux disease (GERD), the foods you eat and your eating habits are very important. Choosing the right foods can help ease the discomfort of GERD. WHAT GENERAL GUIDELINES DO I NEED TO FOLLOW?  Choose fruits, vegetables, whole grains, low-fat dairy products, and low-fat meat, fish, and poultry.  Limit fats such as oils, salad dressings, butter, nuts, and avocado.  Keep a food diary to identify foods that cause symptoms.  Avoid foods that cause reflux. These may be different for different people.  Eat frequent small meals instead of three large meals each day.  Eat your meals slowly, in a relaxed setting.  Limit fried foods.  Cook foods using methods other than frying.  Avoid drinking alcohol.  Avoid drinking large amounts of liquids with your meals.  Avoid bending over or lying down until 2-3 hours after eating. WHAT FOODS ARE NOT RECOMMENDED? The following are some foods and drinks that may worsen your symptoms: Vegetables Tomatoes. Tomato juice. Tomato and spaghetti sauce. Chili peppers. Onion and garlic. Horseradish. Fruits Oranges, grapefruit, and lemon (fruit and  juice). Meats High-fat meats, fish, and poultry. This includes hot dogs, ribs, ham, sausage, salami, and bacon. Dairy Whole milk and chocolate milk. Sour cream. Cream. Butter. Ice cream. Cream cheese.  Beverages Coffee and tea, with or without caffeine. Carbonated beverages or energy drinks. Condiments Hot sauce. Barbecue sauce.  Sweets/Desserts Chocolate and cocoa. Donuts. Peppermint and spearmint. Fats and Oils High-fat foods, including [place] fries and potato chips. Other Vinegar. Strong spices, such as black pepper, white pepper, red pepper, cayenne, curry powder, cloves, ginger, and chili powder. The items listed above may not be a complete list of foods and beverages to avoid. Contact your dietitian for more information. Document Released: 05/07/2005 Document Revised: 05/12/2013 Document Reviewed: 03/11/2013 [facility] Patient Information 2015 [place], [place]. This information is not intended to replace advice given to you by your health care provider. Make sure you discuss any questions you have with your health care provider.

## 2014-06-28 NOTE — ED Provider Notes (Signed)
CSN: [id number]     Arrival date & time 06/28/14  0123 History   First MD Initiated Contact with Patient 06/28/14 0203     Chief Complaint  Patient presents with  . Chest Pain  . Shortness of Breath  . Hypertension     (Consider location/radiation/quality/duration/timing/severity/associated sxs/prior Treatment) HPI Comments: Patient is a 54 yo M presenting to the ED for intermittent episodes of burning central/epigastric chest pain that he describes as burning in nature. He states he has had this pain on and off for 10 years, recently the burning has become more severe. Last episode of pain was at 11:30PM this evening. No modifying factors identified. He states he recently saw a PCP and was prescribed HTN and GERD medications. Patient states he does not feel the medications are working. He states he checked his BP tonight and it was elevated. Denies any fevers, chills, cough, nausea, vomiting, diarrhea, abdominal pain. Patient's echo was 2008, no stress test or cardiac catheterization.    Past Medical History  Diagnosis Date  . Hyperlipidemia   . Hypertension    History reviewed. No pertinent past surgical history. History reviewed. No pertinent family history. History  Substance Use Topics  . Smoking status: Never Smoker   . Smokeless tobacco: Never Used  . Alcohol Use: No    Review of Systems  Respiratory: Negative for shortness of breath.   Cardiovascular: Positive for chest pain.  All other systems reviewed and are negative.     Allergies  Review of patient's allergies indicates no known allergies.  Home Medications   Prior to Admission medications   Medication Sig Start Date End Date Taking? Authorizing Provider  hydrochlorothiazide (HYDRODIURIL) 25 MG tablet Take 1 tablet (25 mg total) by mouth daily. 09/11/13  Yes Raynelle Fanning Idol, PA-C  lisinopril (PRINIVIL,ZESTRIL) 10 MG tablet Take 2 tablets (20 mg total) by mouth daily. 09/11/13  Yes Burgess Amor, PA-C  Esomeprazole  Magnesium (NEXIUM PO) Take 22.3 mg by mouth daily as needed (for heartburn).    Historical Provider, MD  predniSONE (STERAPRED UNI-PAK) 10 MG tablet Take 10-60 tablets by mouth daily. *6 day taper pak. Take  day 1,  day 2,  day 3,  day 4,  day 5, then  day 6*    Historical Provider, MD   BP 167/90 mmHg  Pulse 73  Temp(Src) 97.5 F (36.4 C) (Oral)  Resp 11  Ht  (1.778 m)  Wt 159 lb (72.122 kg)  BMI 22.81 kg/m2  SpO2 99% Physical Exam  Constitutional: He is oriented to person, place, and time. He appears well-developed and well-nourished. No distress.  HENT:  Head: Normocephalic and atraumatic.  Right Ear: External ear normal.  Left Ear: External ear normal.  Nose: Nose normal.  Mouth/Throat: Oropharynx is clear and moist. No oropharyngeal exudate.  Eyes: Conjunctivae are normal.  Neck: Normal range of motion. Neck supple.  Cardiovascular: Normal rate, regular rhythm, normal heart sounds and intact distal pulses.   Pulmonary/Chest: Effort normal and breath sounds normal. No respiratory distress.  Abdominal: Soft. Bowel sounds are normal. He exhibits no distension. There is no tenderness.  Musculoskeletal: Normal range of motion. He exhibits no edema.  Neurological: He is alert and oriented to person, place, and time.  Skin: Skin is warm and dry. He is not diaphoretic.  Nursing note and vitals reviewed.   ED Course  Procedures (including critical care time) Medications  gi cocktail (Maalox,Lidocaine,Donnatal) (30 mLs Oral Given 06/28/14 0356)    Labs Review  Labs Reviewed  BASIC METABOLIC PANEL - Abnormal; Notable for the following:    Glucose, Bld 111 (*)    GFR calc non Af Amer 68 (*)    GFR calc Af Amer 79 (*)    All other components within normal limits  CBC  HEPATIC FUNCTION PANEL  LIPASE, BLOOD  I-STAT TROPOININ, ED    Imaging Review Dg Chest 2 View  06/28/2014   CLINICAL DATA:  Chronic intermittent chest pain and intermittent shortness  of breath. Initial encounter.  EXAM: CHEST  2 VIEW  COMPARISON:  Chest radiograph performed 09/11/2013  FINDINGS: The lungs are well-aerated and clear. There is no evidence of focal opacification, pleural effusion or pneumothorax.  The heart is normal in size; the mediastinal contour is within normal limits. No acute osseous abnormalities are seen.  IMPRESSION: No acute cardiopulmonary process seen.   Electronically Signed   By: Roanna RaiderJeffery  Chang M.D.   On: 06/28/2014 04:09     EKG Interpretation   Date/Time:  Monday June 28 2014 01:33:00 EST Ventricular Rate:  72 PR Interval:  148 QRS Duration: 80 QT Interval:  376 QTC Calculation: 411 R Axis:   45 Text Interpretation:  Sinus rhythm No significant change since last  tracing Confirmed by PLUNKETT  MD, WHITNEY ([id number]) on 06/28/2014 2:37:03 AM      MDM   Final diagnoses:  Chest pain    Filed Vitals:   06/28/14 0430  BP: 167/90  Pulse: 73  Temp:   Resp: 11   Afebrile, NAD, non-toxic appearing, AAOx4.  I have reviewed nursing notes, vital signs, and all appropriate lab and imaging results for this patient.  Patient is to be discharged with recommendation to follow up with PCP in regards to today's hospital visit. Chest pain is not likely of cardiac or pulmonary etiology d/t presentation, low suspicion for PE given no hypoxia tachycardia or respiratory distress, VSS, no tracheal deviation, no JVD or new murmur, RRR, breath sounds equal bilaterally, EKG without acute abnormalities, negative troponin, and negative CXR. Pt has been advised to return to the ED is CP becomes exertional, associated with diaphoresis or nausea, radiates to left jaw/arm, worsens or becomes concerning in any way. Pt appears reliable for follow up and is agreeable to discharge.         Jeannetta EllisJennifer L Londen Bok, PA-C 06/28/14 [id number]  Gwyneth SproutWhitney Plunkett, MD 06/28/14 [phone]

## 2014-06-28 NOTE — ED Notes (Signed)
Patient transported to X-ray 

## 2014-07-30 ENCOUNTER — Other Ambulatory Visit (HOSPITAL_COMMUNITY): Payer: Self-pay | Admitting: Gastroenterology

## 2014-07-30 DIAGNOSIS — R748 Abnormal levels of other serum enzymes: Secondary | ICD-10-CM

## 2014-08-10 ENCOUNTER — Ambulatory Visit (HOSPITAL_COMMUNITY)
Admission: RE | Admit: 2014-08-10 | Discharge: 2014-08-10 | Disposition: A | Discharge status: Home / Self Care | Payer: 59 | Source: Ambulatory Visit | Attending: Gastroenterology | Admitting: Gastroenterology

## 2014-08-10 DIAGNOSIS — R748 Abnormal levels of other serum enzymes: Secondary | ICD-10-CM | POA: Diagnosis present

## 2014-09-02 ENCOUNTER — Encounter (HOSPITAL_COMMUNITY): Payer: Self-pay | Admitting: *Deleted

## 2014-09-02 ENCOUNTER — Emergency Department (HOSPITAL_COMMUNITY): Payer: 59

## 2014-09-02 ENCOUNTER — Emergency Department (HOSPITAL_COMMUNITY)
Admission: EM | Admit: 2014-09-02 | Discharge: 2014-09-02 | Disposition: A | Discharge status: Home / Self Care | Payer: 59 | Attending: Emergency Medicine | Admitting: Emergency Medicine

## 2014-09-02 DIAGNOSIS — R531 Weakness: Secondary | ICD-10-CM

## 2014-09-02 DIAGNOSIS — Z7952 Long term (current) use of systemic steroids: Secondary | ICD-10-CM | POA: Diagnosis not present

## 2014-09-02 DIAGNOSIS — R42 Dizziness and giddiness: Secondary | ICD-10-CM | POA: Diagnosis not present

## 2014-09-02 DIAGNOSIS — Z79899 Other long term (current) drug therapy: Secondary | ICD-10-CM | POA: Diagnosis not present

## 2014-09-02 DIAGNOSIS — M6281 Muscle weakness (generalized): Secondary | ICD-10-CM | POA: Insufficient documentation

## 2014-09-02 DIAGNOSIS — K219 Gastro-esophageal reflux disease without esophagitis: Secondary | ICD-10-CM | POA: Diagnosis not present

## 2014-09-02 DIAGNOSIS — Z8639 Personal history of other endocrine, nutritional and metabolic disease: Secondary | ICD-10-CM | POA: Insufficient documentation

## 2014-09-02 DIAGNOSIS — R110 Nausea: Secondary | ICD-10-CM | POA: Diagnosis present

## 2014-09-02 DIAGNOSIS — I10 Essential (primary) hypertension: Secondary | ICD-10-CM | POA: Insufficient documentation

## 2014-09-02 HISTORY — DX: Gastro-esophageal reflux disease without esophagitis: K21.9

## 2014-09-02 LAB — CBC WITH DIFFERENTIAL/PLATELET
Basophils Absolute: 0 10*3/uL (ref 0.0–0.1)
Basophils Relative: 1 % (ref 0–1)
Eosinophils Absolute: 0 10*3/uL (ref 0.0–0.7)
Eosinophils Relative: 1 % (ref 0–5)
HCT: 44.7 % (ref 39.0–52.0)
Hemoglobin: 15.1 g/dL (ref 13.0–17.0)
Lymphocytes Relative: 23 % (ref 12–46)
Lymphs Abs: 0.9 10*3/uL (ref 0.7–4.0)
MCH: 29.9 pg (ref 26.0–34.0)
MCHC: 33.8 g/dL (ref 30.0–36.0)
MCV: 88.5 fL (ref 78.0–100.0)
Monocytes Absolute: 0.4 10*3/uL (ref 0.1–1.0)
Monocytes Relative: 9 % (ref 3–12)
Neutro Abs: 2.5 10*3/uL (ref 1.7–7.7)
Neutrophils Relative %: 66 % (ref 43–77)
Platelets: 251 10*3/uL (ref 150–400)
RBC: 5.05 MIL/uL (ref 4.22–5.81)
RDW: 12.3 % (ref 11.5–15.5)
WBC: 3.8 10*3/uL — ABNORMAL LOW (ref 4.0–10.5)

## 2014-09-02 LAB — COMPREHENSIVE METABOLIC PANEL
ALT: 23 U/L (ref 0–53)
AST: 28 U/L (ref 0–37)
Albumin: 4.6 g/dL (ref 3.5–5.2)
Alkaline Phosphatase: 78 U/L (ref 39–117)
Anion gap: 6 (ref 5–15)
BUN: 16 mg/dL (ref 6–23)
CO2: 30 mmol/L (ref 19–32)
Calcium: 9.4 mg/dL (ref 8.4–10.5)
Chloride: 102 mmol/L (ref 96–112)
Creatinine, Ser: 1.1 mg/dL (ref 0.50–1.35)
GFR calc Af Amer: 87 mL/min — ABNORMAL LOW (ref >90)
GFR calc non Af Amer: 75 mL/min — ABNORMAL LOW (ref >90)
Glucose, Bld: 111 mg/dL — ABNORMAL HIGH (ref 70–99)
Potassium: 4.7 mmol/L (ref 3.5–5.1)
Sodium: 138 mmol/L (ref 135–145)
Total Bilirubin: 0.6 mg/dL (ref 0.3–1.2)
Total Protein: 7.9 g/dL (ref 6.0–8.3)

## 2014-09-02 LAB — I-STAT TROPONIN, ED: Troponin i, poc: 0 ng/mL (ref 0.00–0.08)

## 2014-09-02 MED ORDER — ONDANSETRON HCL 4 MG PO TABS: 4.0000 mg | ORAL_TABLET | Freq: Four times a day (QID) | ORAL | Status: AC

## 2014-09-02 MED ORDER — MECLIZINE HCL 32 MG PO TABS: 32.0000 mg | ORAL_TABLET | Freq: Three times a day (TID) | ORAL | Status: AC | PRN

## 2014-09-02 MED ORDER — MECLIZINE HCL 25 MG PO TABS
25.0000 mg | ORAL_TABLET | Freq: Once | ORAL | Status: AC
  Administered 2014-09-02: 25 mg via ORAL
  Filled 2014-09-02: qty 1

## 2014-09-02 NOTE — ED Notes (Signed)
Patient states dizziness with movement and nausea. Patient states he can hear "thumping" in his right ear. Patient states he has a history of vertigo

## 2014-09-02 NOTE — Discharge Instructions (Signed)
Benign Positional Vertigo °Vertigo means you feel like you or your surroundings are moving when they are not. Benign positional vertigo is the most common form of vertigo. Benign means that the cause of your condition is not serious. Benign positional vertigo is more common in older adults. °CAUSES  °Benign positional vertigo is the result of an upset in the labyrinth system. This is an area in the middle ear that helps control your balance. This may be caused by a viral infection, head injury, or repetitive motion. However, often no specific cause is found. °SYMPTOMS  °Symptoms of benign positional vertigo occur when you move your head or eyes in different directions. Some of the symptoms may include: °· Loss of balance and falls. °· Vomiting. °· Blurred vision. °· Dizziness. °· Nausea. °· Involuntary eye movements (nystagmus). °DIAGNOSIS  °Benign positional vertigo is usually diagnosed by physical exam. If the specific cause of your benign positional vertigo is unknown, your caregiver may perform imaging tests, such as magnetic resonance imaging (MRI) or computed tomography (CT). °TREATMENT  °Your caregiver may recommend movements or procedures to correct the benign positional vertigo. Medicines such as meclizine, benzodiazepines, and medicines for nausea may be used to treat your symptoms. In rare cases, if your symptoms are caused by certain conditions that affect the inner ear, you may need surgery. °HOME CARE INSTRUCTIONS  °· Follow your caregiver's instructions. °· Move slowly. Do not make sudden body or head movements. °· Avoid driving. °· Avoid operating heavy machinery. °· Avoid performing any tasks that would be dangerous to you or others during a vertigo episode. °· Drink enough fluids to keep your urine clear or pale yellow. °SEEK IMMEDIATE MEDICAL CARE IF:  °· You develop problems with walking, weakness, numbness, or using your arms, hands, or legs. °· You have difficulty speaking. °· You develop  severe headaches. °· Your nausea or vomiting continues or gets worse. °· You develop visual changes. °· Your family or friends notice any behavioral changes. °· Your condition gets worse. °· You have a fever. °· You develop a stiff neck or sensitivity to light. °MAKE SURE YOU:  °· Understand these instructions. °· Will watch your condition. °· Will get help right away if you are not doing well or get worse. °Document Released: 02/12/2006 Document Revised: 07/30/2011 Document Reviewed: 01/25/2011 °[employer]® Patient Information ©2015 [employer]. This information is not intended to replace advice given to you by your health care provider. Make sure you discuss any questions you have with your health care provider. ° °Dizziness °Dizziness is a common problem. It is a feeling of unsteadiness or light-headedness. You may feel like you are about to faint. Dizziness can lead to injury if you stumble or fall. A person of any age group can suffer from dizziness, but dizziness is more common in older adults. °CAUSES  °Dizziness can be caused by many different things, including: °· Middle ear problems. °· Standing for too long. °· Infections. °· An allergic reaction. °· Aging. °· An emotional response to something, such as the sight of blood. °· Side effects of medicines. °· Tiredness. °· Problems with circulation or blood pressure. °· Excessive use of alcohol or medicines, or illegal drug use. °· Breathing too fast (hyperventilation). °· An irregular heart rhythm (arrhythmia). °· A low red blood cell count (anemia). °· Pregnancy. °· Vomiting, diarrhea, fever, or other illnesses that cause body fluid loss (dehydration). °· Diseases or conditions such as Parkinson's disease, high blood pressure (hypertension), diabetes, and thyroid problems. °·   Exposure to extreme heat. DIAGNOSIS  Your health care provider will ask about your symptoms, perform a physical exam, and perform an electrocardiogram (ECG) to record the electrical  activity of your heart. Your health care provider may also perform other heart or blood tests to determine the cause of your dizziness. These may include:  Transthoracic echocardiogram (TTE). During echocardiography, sound waves are used to evaluate how blood flows through your heart.  Transesophageal echocardiogram (TEE).  Cardiac monitoring. This allows your health care provider to monitor your heart rate and rhythm in real time.  Holter monitor. This is a portable device that records your heartbeat and can help diagnose heart arrhythmias. It allows your health care provider to track your heart activity for several days if needed.  Stress tests by exercise or by giving medicine that makes the heart beat faster. TREATMENT  Treatment of dizziness depends on the cause of your symptoms and can vary greatly. HOME CARE INSTRUCTIONS   Drink enough fluids to keep your urine clear or pale yellow. This is especially important in very hot weather. In older adults, it is also important in cold weather.  Take your medicine exactly as directed if your dizziness is caused by medicines. When taking blood pressure medicines, it is especially important to get up slowly.  Rise slowly from chairs and steady yourself until you feel okay.  In the morning, first sit up on the side of the bed. When you feel okay, stand slowly while holding onto something until you know your balance is fine.  Move your legs often if you need to stand in one place for a long time. Tighten and relax your muscles in your legs while standing.  Have someone stay with you for 1-2 days if dizziness continues to be a problem. Do this until you feel you are well enough to stay alone. Have the person call your health care provider if he or she notices changes in you that are concerning.  Do not drive or use heavy machinery if you feel dizzy.  Do not drink alcohol. SEEK IMMEDIATE MEDICAL CARE IF:   Your dizziness or light-headedness  gets worse.  You feel nauseous or vomit.  You have problems talking, walking, or using your arms, hands, or legs.  You feel weak.  You are not thinking clearly or you have trouble forming sentences. It may take a friend or family member to notice this.  You have chest pain, abdominal pain, shortness of breath, or sweating.  Your vision changes.  You notice any bleeding.  You have side effects from medicine that seems to be getting worse rather than better. MAKE SURE YOU:   Understand these instructions.  Will watch your condition.  Will get help right away if you are not doing well or get worse. Document Released: 10/31/2000 Document Revised: 05/12/2013 Document Reviewed: 11/24/2010 [facility] Patient Information 2015 [place]. This information is not intended to replace advice given to you by your health care provider. Make sure you discuss any questions you have with your health care provider. Please use medication as directed. Monitor for new or worsening signs or symptoms return immediately if any present. Please contact her primary care provider informed him of her visit today informing them of all relevant data and information.

## 2014-09-02 NOTE — ED Notes (Signed)
Pt states that he woke up at 4am and felt nauseous, flushed and dizzy; pt states that he felt like he needed to go to the BR but was unable to have a BM; pt states that the sx have persisted

## 2014-09-02 NOTE — ED Notes (Signed)
Pt returned from MRI °

## 2014-09-02 NOTE — ED Notes (Signed)
Patient transported to MRI 

## 2014-09-02 NOTE — ED Provider Notes (Signed)
CSN: [id number]     Arrival date & time 09/02/14  0636 History   First MD Initiated Contact with Patient 09/02/14 0701     Chief Complaint  Patient presents with  . Nausea     HPI   54 year old male presents today with acute episode of dizziness, throbbing sensation in his right ear, chest pain, nausea. Patient reports that symptoms are present when he woke up this morning and were worse with ambulation finding it difficult to make it to the bathroom. He reports with his head side-to-side increases the dizziness. He reports similar episode several years prior after a trip on an airplane. Denies vomiting. Patient denies fever, headache, changes in hearing, focal weaknesses, changes in mental status, shortness of breath, palpitations, changes in stool frequency characteristics, changes in urination. He does endorse chest "pressure" that has been present for "many years", with full negative workup. He reports that this pressure is intermittent and no worse today than normal. Patient denies trauma to the head, reports even eating and drinking appropriately, no recent illnesses.   Past Medical History  Diagnosis Date  . Hyperlipidemia   . Hypertension   . GERD (gastroesophageal reflux disease)    History reviewed. No pertinent past surgical history. History reviewed. No pertinent family history. History  Substance Use Topics  . Smoking status: Never Smoker   . Smokeless tobacco: Never Used  . Alcohol Use: No    Review of Systems  All other systems reviewed and are negative.   Allergies  Review of patient's allergies indicates no known allergies.  Home Medications   Prior to Admission medications   Medication Sig Start Date End Date Taking? Authorizing Provider  Esomeprazole Magnesium (NEXIUM PO) Take 22.3 mg by mouth daily as needed (for heartburn).    Historical Provider, MD  hydrochlorothiazide (HYDRODIURIL) 25 MG tablet Take 1 tablet (25 mg total) by mouth daily. 09/11/13   Burgess AmorJulie  Idol, PA-C  lisinopril (PRINIVIL,ZESTRIL) 10 MG tablet Take 2 tablets (20 mg total) by mouth daily. 09/11/13   Burgess AmorJulie Idol, PA-C  predniSONE (STERAPRED UNI-PAK) 10 MG tablet Take 10-60 tablets by mouth daily. *6 day taper pak. Take 60mg  day 1, 50mg  day 2, 40mg  day 3, 30mg  day 4, 20mg  day 5, then 10mg  day 6*    Historical Provider, MD   BP 173/84 mmHg  Pulse 71  Temp(Src) 98.2 F (36.8 C) (Oral)  Resp 16  SpO2 100% Physical Exam  Constitutional: He is oriented to person, place, and time. He appears well-developed and well-nourished.  HENT:  Head: Normocephalic and atraumatic.  Eyes: Pupils are equal, round, and reactive to light.  Neck: Normal range of motion. Neck supple. No JVD present. No tracheal deviation present. No thyromegaly present.  Cardiovascular: Normal rate, regular rhythm, normal heart sounds and intact distal pulses.  Exam reveals no gallop and no friction rub.   No murmur heard. Pulmonary/Chest: Effort normal and breath sounds normal. No stridor. No respiratory distress. He has no wheezes. He has no rales. He exhibits no tenderness.  Musculoskeletal: Normal range of motion.  Lymphadenopathy:    He has no cervical adenopathy.  Neurological: He is alert and oriented to person, place, and time. He displays no atrophy and no tremor. No cranial nerve deficit or sensory deficit. He exhibits normal muscle tone. He displays a negative Romberg sign. He displays no seizure activity. Coordination and gait normal. GCS eye subscore is 4. GCS verbal subscore is 5. GCS motor subscore is 6.  Reflex Scores:  Patellar reflexes are 2+ on the right side and 2+ on the left side. Shoulder shrug 4/5 left, straight leg raise left 4 out of 5. All other strength testing 5 out of 5  Skin: Skin is warm and dry.  Psychiatric: He has a normal mood and affect. His behavior is normal. Judgment and thought content normal.  Nursing note and vitals reviewed.   ED Course  Procedures (including critical  care time) Labs Review Labs Reviewed  BASIC METABOLIC PANEL  I-STAT TROPOININ, ED    Imaging Review No results found.   EKG Interpretation- Sinus rhythm 70BPM- No St-elevation or there concerning findings      MDM   Final diagnoses:  Dizziness  Weakness of left side of body   Labs: I-STAT troponin, CBC, CMP  Imaging: MRI brain normal noncontrast MRI appearance of the brain  Consults: None  Therapeutics: Meclizine  Assessment: Dizziness  Plan: Patient presented with an acute episode of dizziness with no associated symptoms with the addition of nausea. Patient does not have a significant past cardiac history, denied headache, chest pain, shortness of breath, palpitations, fever. He does endorse one previous episode with change in pressure after riding on an airplane. This presentation is most consistent with vertigo, he was given meclizine and noted great improvement in his symptoms. He does report low level amount of dizziness with head movement at this time. Repeat neuro exam was unremarkable with 5/5 strength throughout. Patient was discharged home with meclizine, Zofran, and instructions to follow-up with his primary care provider as soon as possible. He is instructed to inform him of his ED visit and all relevant information including addition of new medications. He is instructed to monitor for new or worsening signs or symptoms, including neurological deficits, chest pain palpitations fever. Patient understood and agreed to the plan.      Eyvonne Mechanic, PA-C 09/03/14 1233  Rolland Porter, MD 09/09/14 [phone]

## 2015-11-08 DIAGNOSIS — E784 Other hyperlipidemia: Secondary | ICD-10-CM | POA: Diagnosis not present

## 2015-11-08 DIAGNOSIS — M1000 Idiopathic gout, unspecified site: Secondary | ICD-10-CM | POA: Diagnosis not present

## 2015-11-08 DIAGNOSIS — Z23 Encounter for immunization: Secondary | ICD-10-CM | POA: Diagnosis not present

## 2015-11-08 DIAGNOSIS — K219 Gastro-esophageal reflux disease without esophagitis: Secondary | ICD-10-CM | POA: Diagnosis not present

## 2015-11-15 DIAGNOSIS — B1910 Unspecified viral hepatitis B without hepatic coma: Secondary | ICD-10-CM | POA: Diagnosis not present

## 2016-03-25 DIAGNOSIS — J309 Allergic rhinitis, unspecified: Secondary | ICD-10-CM | POA: Diagnosis not present

## 2016-03-25 DIAGNOSIS — H6501 Acute serous otitis media, right ear: Secondary | ICD-10-CM | POA: Diagnosis not present

## 2016-03-25 DIAGNOSIS — I10 Essential (primary) hypertension: Secondary | ICD-10-CM | POA: Diagnosis not present

## 2016-03-25 DIAGNOSIS — R079 Chest pain, unspecified: Secondary | ICD-10-CM | POA: Diagnosis not present

## 2016-03-27 IMAGING — CR DG CHEST 2V
2 series · 2 of 2 positions shown · non-contrast
Comparison: Chest radiograph performed 09/11/2013

CLINICAL DATA: Chronic intermittent chest pain and intermittent
shortness of breath. Initial encounter.

EXAM:
CHEST  2 VIEW

[w chest pa]
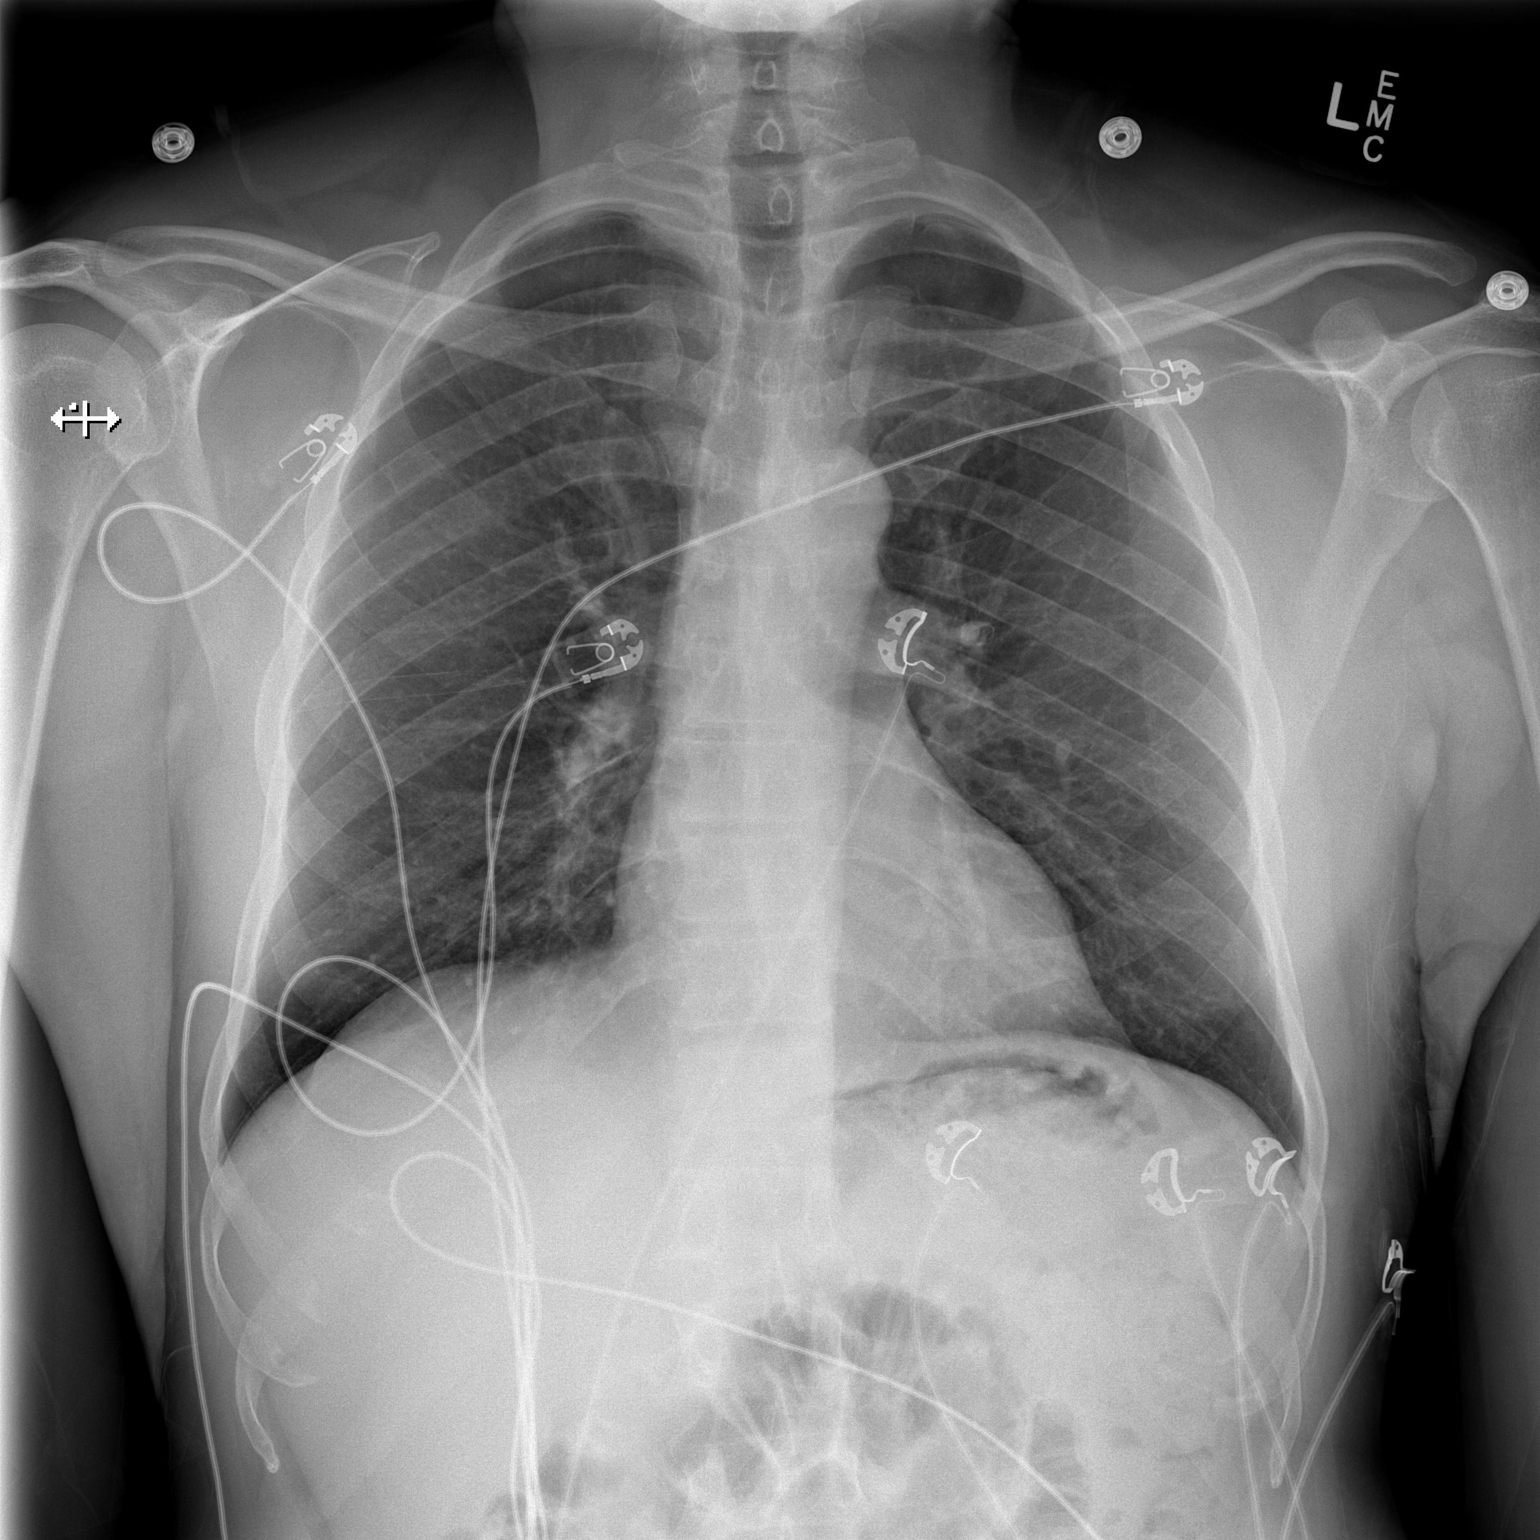

[w chest lat]
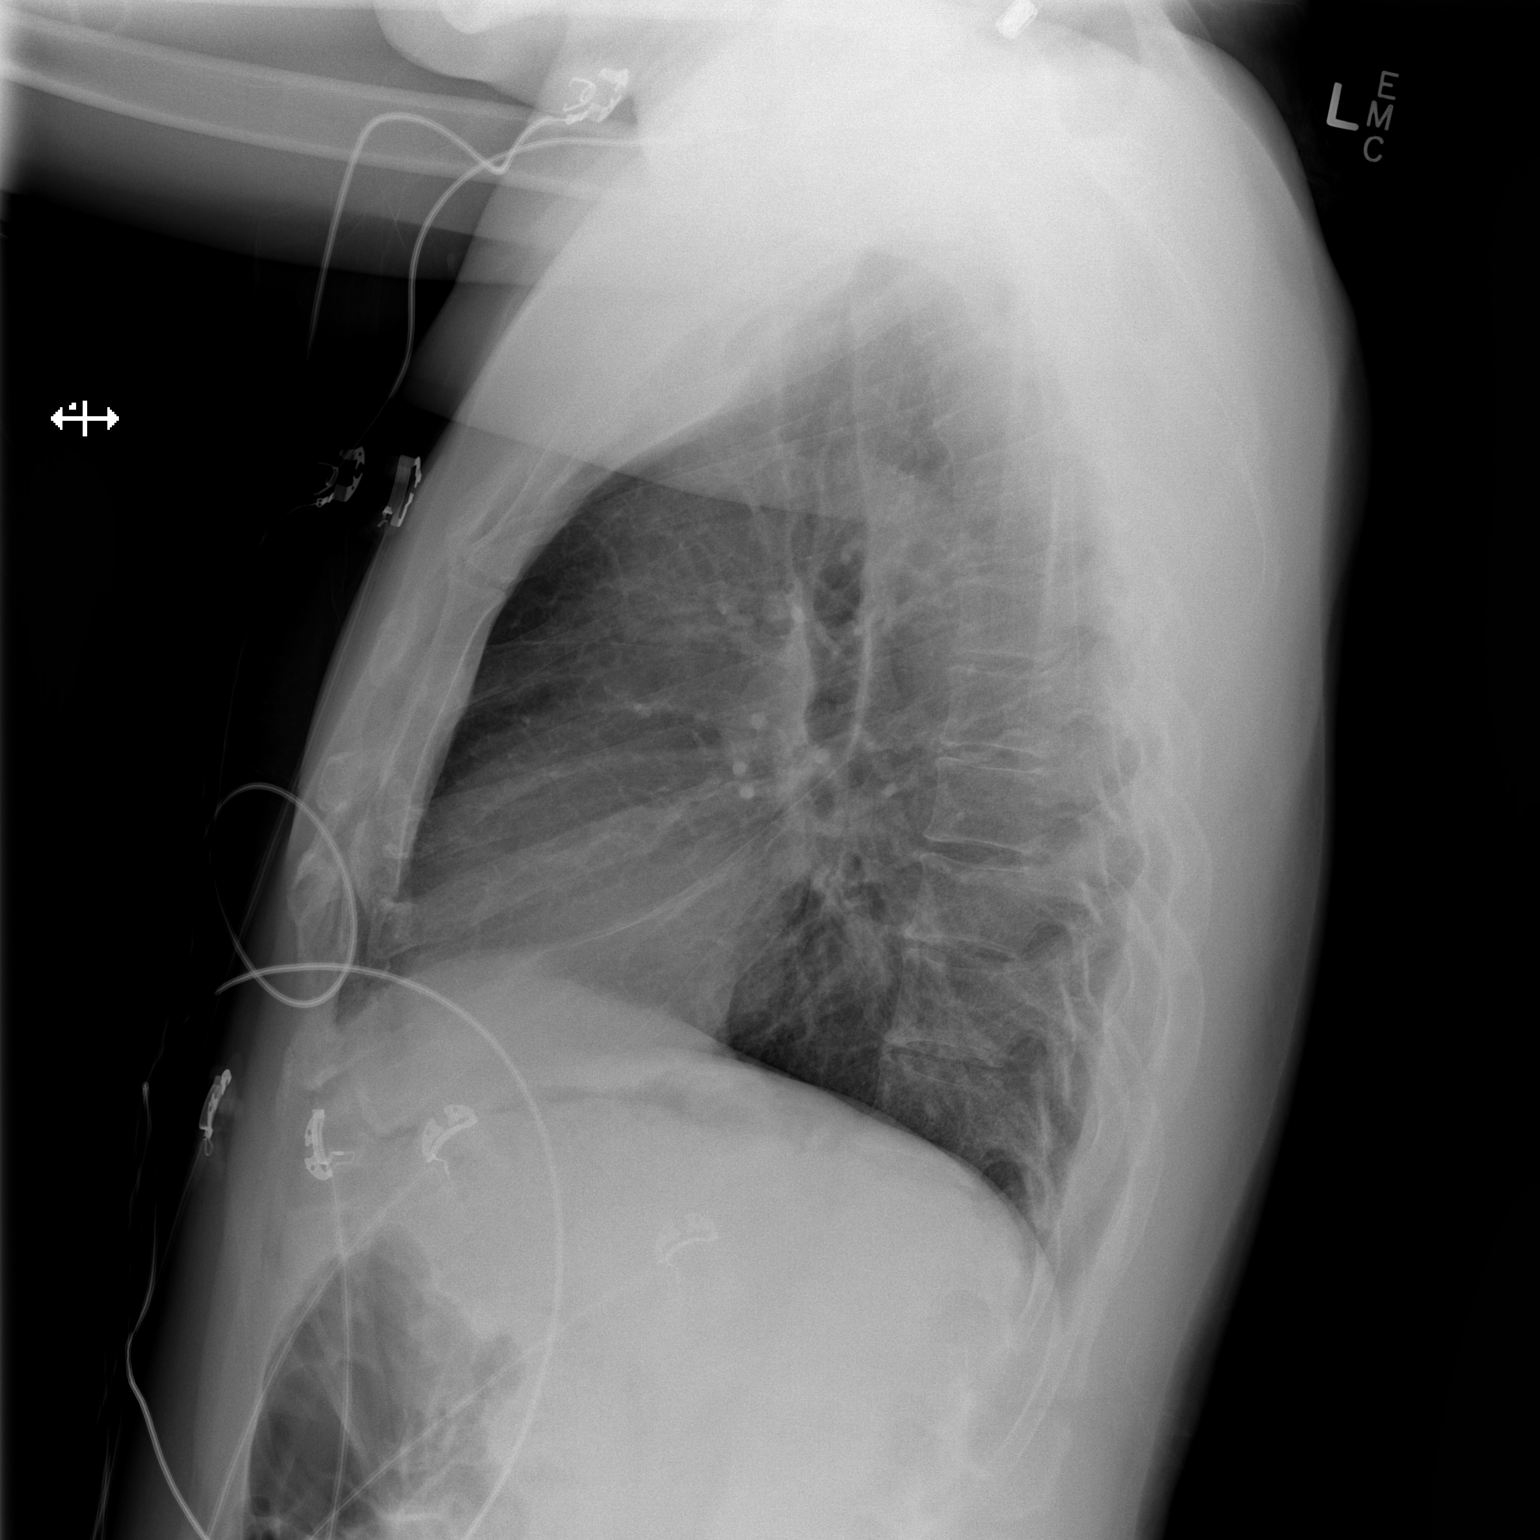

[2 of 2 positions shown; findings below may reference images not displayed]

FINDINGS: The lungs are well-aerated and clear. There is no evidence of focal
opacification, pleural effusion or pneumothorax.

The heart is normal in size; the mediastinal contour is within
normal limits. No acute osseous abnormalities are seen.
IMPRESSION: No acute cardiopulmonary process seen.

## 2016-05-08 DIAGNOSIS — M1000 Idiopathic gout, unspecified site: Secondary | ICD-10-CM | POA: Diagnosis not present

## 2016-05-08 DIAGNOSIS — J209 Acute bronchitis, unspecified: Secondary | ICD-10-CM | POA: Diagnosis not present

## 2016-07-17 DIAGNOSIS — H9201 Otalgia, right ear: Secondary | ICD-10-CM | POA: Diagnosis not present

## 2016-07-17 DIAGNOSIS — K219 Gastro-esophageal reflux disease without esophagitis: Secondary | ICD-10-CM | POA: Diagnosis not present

## 2016-07-19 ENCOUNTER — Encounter: Payer: Self-pay | Admitting: Gastroenterology

## 2016-07-31 DIAGNOSIS — Z131 Encounter for screening for diabetes mellitus: Secondary | ICD-10-CM | POA: Diagnosis not present

## 2016-07-31 DIAGNOSIS — E784 Other hyperlipidemia: Secondary | ICD-10-CM | POA: Diagnosis not present

## 2016-07-31 DIAGNOSIS — Z125 Encounter for screening for malignant neoplasm of prostate: Secondary | ICD-10-CM | POA: Diagnosis not present

## 2016-07-31 DIAGNOSIS — K219 Gastro-esophageal reflux disease without esophagitis: Secondary | ICD-10-CM | POA: Diagnosis not present

## 2016-07-31 DIAGNOSIS — M545 Low back pain: Secondary | ICD-10-CM | POA: Diagnosis not present

## 2016-07-31 DIAGNOSIS — M1000 Idiopathic gout, unspecified site: Secondary | ICD-10-CM | POA: Diagnosis not present

## 2016-08-24 ENCOUNTER — Other Ambulatory Visit (INDEPENDENT_AMBULATORY_CARE_PROVIDER_SITE_OTHER): Payer: BLUE CROSS/BLUE SHIELD

## 2016-08-24 ENCOUNTER — Encounter: Payer: Self-pay | Admitting: Gastroenterology

## 2016-08-24 ENCOUNTER — Ambulatory Visit (INDEPENDENT_AMBULATORY_CARE_PROVIDER_SITE_OTHER): Payer: BLUE CROSS/BLUE SHIELD | Admitting: Gastroenterology

## 2016-08-24 ENCOUNTER — Encounter (INDEPENDENT_AMBULATORY_CARE_PROVIDER_SITE_OTHER): Payer: Self-pay

## 2016-08-24 VITALS — BP 160/92 | HR 90 | Ht 70.0 in | Wt 182.0 lb

## 2016-08-24 DIAGNOSIS — R1310 Dysphagia, unspecified: Secondary | ICD-10-CM | POA: Diagnosis not present

## 2016-08-24 DIAGNOSIS — B1910 Unspecified viral hepatitis B without hepatic coma: Secondary | ICD-10-CM

## 2016-08-24 DIAGNOSIS — K219 Gastro-esophageal reflux disease without esophagitis: Secondary | ICD-10-CM | POA: Diagnosis not present

## 2016-08-24 LAB — CBC WITH DIFFERENTIAL/PLATELET
BASOS ABS: 0 10*3/uL (ref 0.0–0.1)
Basophils Relative: 1.1 % (ref 0.0–3.0)
EOS ABS: 0.1 10*3/uL (ref 0.0–0.7)
Eosinophils Relative: 3.6 % (ref 0.0–5.0)
HEMATOCRIT: 44.3 % (ref 39.0–52.0)
Hemoglobin: 14.8 g/dL (ref 13.0–17.0)
LYMPHS PCT: 25.4 % (ref 12.0–46.0)
Lymphs Abs: 1 10*3/uL (ref 0.7–4.0)
MCHC: 33.3 g/dL (ref 30.0–36.0)
MCV: 88.7 fl (ref 78.0–100.0)
Monocytes Absolute: 0.4 10*3/uL (ref 0.1–1.0)
Monocytes Relative: 10.8 % (ref 3.0–12.0)
NEUTROS ABS: 2.3 10*3/uL (ref 1.4–7.7)
Neutrophils Relative %: 59.1 % (ref 43.0–77.0)
Platelets: 269 10*3/uL (ref 150.0–400.0)
RBC: 4.99 Mil/uL (ref 4.22–5.81)
RDW: 13.4 % (ref 11.5–15.5)
WBC: 3.9 10*3/uL — AB (ref 4.0–10.5)

## 2016-08-24 LAB — HEPATIC FUNCTION PANEL
ALT: 26 U/L (ref 0–53)
AST: 26 U/L (ref 0–37)
Albumin: 4.5 g/dL (ref 3.5–5.2)
Alkaline Phosphatase: 79 U/L (ref 39–117)
BILIRUBIN DIRECT: 0.1 mg/dL (ref 0.0–0.3)
TOTAL PROTEIN: 7.6 g/dL (ref 6.0–8.3)
Total Bilirubin: 0.5 mg/dL (ref 0.2–1.2)

## 2016-08-24 MED ORDER — OMEPRAZOLE 40 MG PO CPDR: 40.0000 mg | DELAYED_RELEASE_CAPSULE | Freq: Two times a day (BID) | ORAL | 1 refills | Status: DC

## 2016-08-24 NOTE — Patient Instructions (Signed)
If you are age 55 or older, your body mass index should be between 23-30. Your Body mass index is 26.11 kg/m. If this is out of the aforementioned range listed, please consider follow up with your Primary Care Provider.  If you are age 34 or younger, your body mass index should be between 19-25. Your Body mass index is 26.11 kg/m. If this is out of the aformentioned range listed, please consider follow up with your Primary Care Provider.   We have sent the following medications to your pharmacy for you to pick up at your convenience:  Omeprazole  You have been scheduled for an endoscopy. Please follow written instructions given to you at your visit today. If you use inhalers (even only as needed), please bring them with you on the day of your procedure. Your physician has requested that you go to www.startemmi.com and enter the access code given to you at your visit today. This web site gives a general overview about your procedure. However, you should still follow specific instructions given to you by our office regarding your preparation for the procedure.  Your physician has requested that you go to the basement for lab work before leaving today.  You have been scheduled for an abdominal ultrasound at [facility] (1st floor of hospital) on Thursday, April 12th at 8:30am. Please arrive 15 minutes prior to your appointment for registration. Make certain not to have anything to eat or drink 6 hours prior to your appointment. Should you need to reschedule your appointment, please contact radiology at [phone]. This test typically takes about 30 minutes to perform.  Thank you.

## 2016-08-24 NOTE — Progress Notes (Signed)
HPI :  56 y/o male with a reported history of hepatitis B, GERD, HTN, HLD, here for evaluation for reflux / heartburn.   He has symptoms of regurgitation. He reports ongoing daily. He reports ongoing for several years. He has some pyrosis along with this and constant "burning" in his chest. He has some occasional sensation of "choking" when he swallows, mild dysphagia in upper esophagus. No nausea or vomiting. Weight is stable. He is taking prilosec  once daily which helps mildly but doesn't take it away and has significant daily breakthrough. He reports it bothers him if he eats prior to bedtime and also during the day. Good appetite. He has been seen by ENT for symptoms in his throat and ears, thought to be due to reflux.  He thinks he has had an EGD a very long time ago, > 10 years ago.   Prior colonoscopy in 2015.   No FH of CRC.  Of note, he has on file in Epic a [place] liver 2016 with elastography. He had moderate risk of fibrosis at the time. Upon further questioning he was told he had hepatitis B. I see had had a positive hepatitis B surface antigen in 2010 but no other liver labs noted. He has not had any prior therapy for hep B. No FH of hepatitis B. He is from [place] originally.   Past Medical History:  Diagnosis Date  . GERD (gastroesophageal reflux disease)   . Hepatitis B   . Hyperlipidemia   . Hypertension      History reviewed. No pertinent surgical history. History reviewed. No pertinent family history. Social History  Substance Use Topics  . Smoking status: Never Smoker  . Smokeless tobacco: Never Used  . Alcohol use No   Current Outpatient Prescriptions  Medication Sig Dispense Refill  . acetaminophen (TYLENOL) 500 MG tablet Take 1,000 mg by mouth every 6 (six) hours as needed for mild pain or moderate pain.    . hydrochlorothiazide (HYDRODIURIL) 25 MG tablet Take 1 tablet (25 mg total) by mouth daily. 30 tablet 0  . lisinopril (PRINIVIL,ZESTRIL) 10 MG tablet  Take 2 tablets (20 mg total) by mouth daily. 30 tablet 0  . meclizine (ANTIVERT) 32 MG tablet Take 1 tablet (32 mg total) by mouth 3 (three) times daily as needed. 30 tablet 0  . omeprazole (PRILOSEC) 20 MG capsule Take 1 capsule by mouth daily.    . ondansetron (ZOFRAN) 4 MG tablet Take 1 tablet (4 mg total) by mouth every 6 (six) hours. 12 tablet 0  . omeprazole (PRILOSEC) 40 MG capsule Take 1 capsule (40 mg total) by mouth 2 (two) times daily. 90 capsule 1  . valsartan-hydrochlorothiazide (DIOVAN-HCT) 80-12.5 MG per tablet Take 1 tablet by mouth daily.     No current facility-administered medications for this visit.    No Known Allergies   Review of Systems: All systems reviewed and negative except where noted in HPI.   Lab Results  Component Value Date   WBC 3.8 (L) 09/02/2014   HGB 15.1 09/02/2014   HCT 44.7 09/02/2014   MCV 88.5 09/02/2014   PLT 251 09/02/2014    Lab Results  Component Value Date   ALT 23 09/02/2014   AST 28 09/02/2014   ALKPHOS 78 09/02/2014   BILITOT 0.6 09/02/2014    Lab Results  Component Value Date   CREATININE 1.10 09/02/2014   BUN 16 09/02/2014   NA 138 09/02/2014   K 4.7 09/02/2014   CL  102 09/02/2014   CO2 30 09/02/2014    No results found for: HAV, HEPAIGM, HEPBIGM, HEPBCAB, HBEAG, HEPCAB   Physical Exam: BP (!) 160/92   Pulse 90   Ht  (1.778 m)   Wt 182 lb (82.6 kg)   BMI 26.11 kg/m  Constitutional: Pleasant,well-developed, male in no acute distress. HEENT: Normocephalic and atraumatic. Conjunctivae are normal. No scleral icterus. Neck supple.  Cardiovascular: Normal rate, regular rhythm.  Pulmonary/chest: Effort normal and breath sounds normal. No wheezing, rales or rhonchi. Abdominal: Soft, nondistended, nontender.  There are no masses palpable. No hepatomegaly appreciated. Extremities: no edema Lymphadenopathy: No cervical adenopathy noted. Neurological: Alert and oriented to person place and time. Skin: Skin is  warm and dry. No rashes noted. Psychiatric: Normal mood and affect. Behavior is normal.   ASSESSMENT AND PLAN: 56 year old male from [place], here to establish care for the following issues.  GERD / dysphagia - chronic symptoms of regurgitation and pyrosis which have persisted despite low-dose omeprazole, symptoms bothering him significantly. We discussed reflux in general, and management options. Given his significant symptoms recommend trial of high-dose omeprazole, 40 mg twice daily, for a few weeks to see if this helps. Otherwise recommend upper endoscopy given his refractory symptoms and dysphagia to further evaluate. I discussed risks and benefits of endoscopy and he wished proceed. Further recommendations pending this result.  Hepatitis B - details of this are unknown, I don't see any prior viral load or hepatitis B E antigen or hepatitis B E antibody, we'll check these as well as LFTs. He had a prior ultrasound elastography 2 years ago showing moderate risk for fibrotic changes, given his history of hepatitis B, age, and ethnicity he is due for surveillance ultrasound and AFP level for [facility] screening. We'll let him know the results and recommendations regarding possible long-term therapy pending results. He agreed.   Ileene Patrick, MD [facility] Pager [phone]  CC: Gwenlyn Perking, PA-C

## 2016-08-27 LAB — HEPATITIS B SURFACE ANTIGEN: Hepatitis B Surface Ag: POSITIVE — AB

## 2016-08-27 LAB — HEPATITIS B E ANTIGEN: Hepatitis Be Antigen: NONREACTIVE

## 2016-08-27 LAB — HEPATITIS B SURFACE ANTIBODY,QUALITATIVE: Hep B S Ab: NEGATIVE

## 2016-08-27 LAB — HEPATITIS B SURF AG CONFIRMATION: HEPATITIS B SURFACE ANTIGEN CONFIRMATION: POSITIVE — AB

## 2016-08-27 LAB — AFP TUMOR MARKER: AFP-Tumor Marker: 7.1 ng/mL — ABNORMAL HIGH (ref <6.1)

## 2016-08-27 LAB — HEPATITIS B E ANTIBODY: Hepatitis Be Antibody: REACTIVE — AB

## 2016-08-28 ENCOUNTER — Encounter: Payer: BLUE CROSS/BLUE SHIELD | Admitting: Gastroenterology

## 2016-08-28 ENCOUNTER — Telehealth: Payer: Self-pay | Admitting: Gastroenterology

## 2016-08-28 ENCOUNTER — Encounter: Payer: Self-pay | Admitting: Gastroenterology

## 2016-08-28 LAB — HEPATITIS B DNA, ULTRAQUANTITATIVE, PCR
HEPATITIS B DNA (CALC): 2.8 {Log_IU}/mL — AB
Hepatitis B DNA: 633 IU/mL — ABNORMAL HIGH

## 2016-08-28 NOTE — Telephone Encounter (Signed)
Okay thanks for letting me know

## 2016-08-28 NOTE — Telephone Encounter (Signed)
Patient called in stating that he thought his procedure was on a different day. Procedure rescheduled.

## 2016-08-30 ENCOUNTER — Encounter: Payer: Self-pay | Admitting: Gastroenterology

## 2016-08-30 ENCOUNTER — Ambulatory Visit (HOSPITAL_COMMUNITY)
Admission: RE | Admit: 2016-08-30 | Discharge: 2016-08-30 | Disposition: A | Discharge status: Home / Self Care | Payer: BLUE CROSS/BLUE SHIELD | Source: Ambulatory Visit | Attending: Gastroenterology | Admitting: Gastroenterology

## 2016-08-30 DIAGNOSIS — B1910 Unspecified viral hepatitis B without hepatic coma: Secondary | ICD-10-CM | POA: Diagnosis not present

## 2016-08-30 DIAGNOSIS — K219 Gastro-esophageal reflux disease without esophagitis: Secondary | ICD-10-CM | POA: Insufficient documentation

## 2016-09-14 ENCOUNTER — Encounter: Payer: Self-pay | Admitting: Gastroenterology

## 2016-09-14 ENCOUNTER — Other Ambulatory Visit (HOSPITAL_COMMUNITY)
Admission: RE | Admit: 2016-09-14 | Discharge: 2016-09-14 | Disposition: A | Discharge status: Home / Self Care | Payer: BLUE CROSS/BLUE SHIELD | Source: Ambulatory Visit | Attending: Gastroenterology | Admitting: Gastroenterology

## 2016-09-14 ENCOUNTER — Ambulatory Visit (AMBULATORY_SURGERY_CENTER): Payer: BLUE CROSS/BLUE SHIELD | Admitting: Gastroenterology

## 2016-09-14 VITALS — BP 165/88 | HR 86 | Temp 97.8°F | Resp 15 | Ht 70.0 in | Wt 182.0 lb

## 2016-09-14 DIAGNOSIS — K219 Gastro-esophageal reflux disease without esophagitis: Secondary | ICD-10-CM | POA: Diagnosis not present

## 2016-09-14 DIAGNOSIS — B379 Candidiasis, unspecified: Secondary | ICD-10-CM | POA: Diagnosis not present

## 2016-09-14 DIAGNOSIS — B1910 Unspecified viral hepatitis B without hepatic coma: Secondary | ICD-10-CM | POA: Insufficient documentation

## 2016-09-14 DIAGNOSIS — R1319 Other dysphagia: Secondary | ICD-10-CM

## 2016-09-14 DIAGNOSIS — B3781 Candidal esophagitis: Secondary | ICD-10-CM

## 2016-09-14 DIAGNOSIS — R1310 Dysphagia, unspecified: Secondary | ICD-10-CM | POA: Diagnosis not present

## 2016-09-14 MED ORDER — SODIUM CHLORIDE 0.9 % IV SOLN: 500.0000 mL | INTRAVENOUS | Status: AC

## 2016-09-14 NOTE — Progress Notes (Signed)
To Pacu, VSS. Rrport to RN.

## 2016-09-14 NOTE — Progress Notes (Signed)
YOU HAD AN ENDOSCOPIC PROCEDURE TODAY AT THE [facility]:   Refer to the procedure report that was given to you for any specific questions about what was found during the examination.  If the procedure report does not answer your questions, please call your gastroenterologist to clarify.  If you requested that your care partner not be given the details of your procedure findings, then the procedure report has been included in a sealed envelope for you to review at your convenience later.  YOU SHOULD EXPECT: Some feelings of bloating in the abdomen. Passage of more gas than usual.  Walking can help get rid of the air that was put into your GI tract during the procedure and reduce the bloating. If you had a lower endoscopy (such as a colonoscopy or flexible sigmoidoscopy) you may notice spotting of blood in your stool or on the toilet paper. If you underwent a bowel prep for your procedure, you may not have a normal bowel movement for a few days.  Please Note:  You might notice some irritation and congestion in your nose or some drainage.  This is from the oxygen used during your procedure.  There is no need for concern and it should clear up in a day or so.  SYMPTOMS TO REPORT IMMEDIATELY:   Following lower endoscopy (colonoscopy or flexible sigmoidoscopy):  Excessive amounts of blood in the stool  Significant tenderness or worsening of abdominal pains  Swelling of the abdomen that is new, acute  Fever of 100F or higher   Following upper endoscopy (EGD)  Vomiting of blood or coffee ground material  New chest pain or pain under the shoulder blades  Painful or persistently difficult swallowing  New shortness of breath  Fever of 100F or higher  Black, tarry-looking stools  For urgent or emergent issues, a gastroenterologist can be reached at any hour by calling [phone].   DIET:  We do recommend a small meal at first, but then you may proceed to your regular diet.  Drink  plenty of fluids but you should avoid alcoholic beverages for 24 hours.  ACTIVITY:  You should plan to take it easy for the rest of today and you should NOT DRIVE or use heavy machinery until tomorrow (because of the sedation medicines used during the test).    FOLLOW UP: Our staff will call the number listed on your records the next business day following your procedure to check on you and address any questions or concerns that you may have regarding the information given to you following your procedure. If we do not reach you, we will leave a message.  However, if you are feeling well and you are not experiencing any problems, there is no need to return our call.  We will assume that you have returned to your regular daily activities without incident.  I  Called to room to assist during endoscopic procedure.  Patient ID and intended procedure confirmed with present staff. Received instructions for my participation in the procedure from the performing physician.

## 2016-09-14 NOTE — Progress Notes (Signed)
Pt's states no medical or surgical changes since previsit or office visit. 

## 2016-09-14 NOTE — Patient Instructions (Signed)
YOU HAD AN ENDOSCOPIC PROCEDURE TODAY AT THE [facility]:   Refer to the procedure report that was given to you for any specific questions about what was found during the examination.  If the procedure report does not answer your questions, please call your gastroenterologist to clarify.  If you requested that your care partner not be given the details of your procedure findings, then the procedure report has been included in a sealed envelope for you to review at your convenience later.  YOU SHOULD EXPECT: Some feelings of bloating in the abdomen. Passage of more gas than usual.  Walking can help get rid of the air that was put into your GI tract during the procedure and reduce the bloating. If you had a lower endoscopy (such as a colonoscopy or flexible sigmoidoscopy) you may notice spotting of blood in your stool or on the toilet paper. If you underwent a bowel prep for your procedure, you may not have a normal bowel movement for a few days.  Please Note:  You might notice some irritation and congestion in your nose or some drainage.  This is from the oxygen used during your procedure.  There is no need for concern and it should clear up in a day or so.  SYMPTOMS TO REPORT IMMEDIATELY:    Following upper endoscopy (EGD)  Vomiting of blood or coffee ground material  New chest pain or pain under the shoulder blades  Painful or persistently difficult swallowing  New shortness of breath  Fever of 100F or higher  Black, tarry-looking stools  For urgent or emergent issues, a gastroenterologist can be reached at any hour by calling [phone].   DIET:  Please follow post dilation diet ( see handout) then you may proceed to your regular diet.  Drink plenty of fluids but you should avoid alcoholic beverages for 24 hours.  ACTIVITY:  You should plan to take it easy for the rest of today and you should NOT DRIVE or use heavy machinery until tomorrow (because of the sedation medicines  used during the test).    FOLLOW UP: Our staff will call the number listed on your records the next business day following your procedure to check on you and address any questions or concerns that you may have regarding the information given to you following your procedure. If we do not reach you, we will leave a message.  However, if you are feeling well and you are not experiencing any problems, there is no need to return our call.  We will assume that you have returned to your regular daily activities without incident.  If any biopsies were taken you will be contacted by phone or by letter within the next 1-3 weeks.  Please call us at [phone] if you have not heard about the biopsies in 3 weeks.    SIGNATURES/CONFIDENTIALITY: You and/or your care partner have signed paperwork which will be entered into your electronic medical record.  These signatures attest to the fact that that the information above on your After Visit Summary has been reviewed and is understood.  Full responsibility of the confidentiality of this discharge information lies with you and/or your care-partner.  Thank you for letting us take care of your healthcare needs today.

## 2016-09-14 NOTE — Op Note (Signed)
[facility] Patient Name: William Watson Procedure Date: 09/14/2016 10:48 AM MRN: [id number] Endoscopist: Viviann Spare P. Armbruster MD, MD Age: 56 Referring MD:  Date of Birth: [birth year] Gender: Male Account #: [id number] Procedure:                Upper GI endoscopy Indications:              Dysphagia, Heartburn - reflux symptoms improved on                            twice daily omeprazole, dysphagia persists Medicines:                Monitored Anesthesia Care Procedure:                Pre-Anesthesia Assessment:                           - Prior to the procedure, a History and Physical                            was performed, and patient medications and                            allergies were reviewed. The patient's tolerance of                            previous anesthesia was also reviewed. The risks                            and benefits of the procedure and the sedation                            options and risks were discussed with the patient.                            All questions were answered, and informed consent                            was obtained. Prior Anticoagulants: The patient has                            taken no previous anticoagulant or antiplatelet                            agents. ASA Grade Assessment: II - A patient with                            mild systemic disease. After reviewing the risks                            and benefits, the patient was deemed in                            satisfactory condition to undergo the procedure.  After obtaining informed consent, the endoscope was                            passed under direct vision. Throughout the                            procedure, the patient's blood pressure, pulse, and                            oxygen saturations were monitored continuously. The                            Endoscope was introduced through the mouth, and                            advanced to  the second part of duodenum. The upper                            GI endoscopy was accomplished without difficulty.                            The patient tolerated the procedure well. Scope In: Scope Out: Findings:                 Esophagogastric landmarks were identified: the                            Z-line was found at 35 cm, the gastroesophageal                            junction was found at 35 cm and the upper extent of                            the gastric folds was found at 37 cm from the                            incisors.                           A 2 cm hiatal hernia was present.                           Multiple diminutive white plaques were found in the                            middle third of the esophagus. Brushings for were                            obtained in the middle third of the esophagus to                            assess for candidiasis.  The exam of the esophagus was otherwise normal.                           A guidewire was placed and the scope was withdrawn.                            Empiric dilation was performed in the entire                            esophagus with a Savary dilator with mild                            resistance at 17 mm and 18 mm. Relook endoscopy                            showed no mucosal wrents.                           The entire examined stomach was normal.                           The duodenal bulb and second portion of the                            duodenum were normal. Complications:            No immediate complications. Estimated blood loss:                            None. Estimated Blood Loss:     Estimated blood loss: none. Impression:               - Esophagogastric landmarks identified.                           - 2 cm hiatal hernia.                           - Multiple diminutive white plaques in the middle                            third of the esophagus. Brushings performed to  rule                            out candidiasis.                           - Empiric dilation performed to 18mm.                           - Normal stomach.                           - Normal duodenal bulb and second portion of the  duodenum. Recommendation:           - Patient has a contact number available for                            emergencies. The signs and symptoms of potential                            delayed complications were discussed with the                            patient. Return to normal activities tomorrow.                            Written discharge instructions were provided to the                            patient.                           - Resume previous diet.                           - Continue present medications.                           - Await pathology results and course following                            dilation Steven P. Armbruster MD, MD 09/14/2016 11:08:49 AM This report has been signed electronically.

## 2016-09-17 ENCOUNTER — Telehealth: Payer: Self-pay | Admitting: *Deleted

## 2016-09-17 NOTE — Telephone Encounter (Signed)
  Follow up Call-  Call back number 09/14/2016  Post procedure Call Back phone  # [phone]  Permission to leave phone message No  comments NO VOICEMAIL  Some recent data might be hidden     Patient questions:  Do you have a fever, pain , or abdominal swelling? No. Pain Score  0 *  Have you tolerated food without any problems? Yes.    Have you been able to return to your normal activities? Yes.    Do you have any questions about your discharge instructions: Diet   No. Medications  No. Follow up visit  No.  Do you have questions or concerns about your Care? No.  Actions: * If pain score is 4 or above: No action needed, pain <4.

## 2016-09-18 ENCOUNTER — Other Ambulatory Visit: Payer: Self-pay

## 2016-09-18 MED ORDER — FLUCONAZOLE 200 MG PO TABS: ORAL_TABLET | ORAL | 0 refills | Status: DC

## 2016-10-01 ENCOUNTER — Other Ambulatory Visit: Payer: Self-pay

## 2016-10-01 ENCOUNTER — Telehealth: Payer: Self-pay | Admitting: Gastroenterology

## 2016-10-01 MED ORDER — OMEPRAZOLE 40 MG PO CPDR: 40.0000 mg | DELAYED_RELEASE_CAPSULE | Freq: Two times a day (BID) | ORAL | 1 refills | Status: AC

## 2016-10-01 NOTE — Telephone Encounter (Signed)
Omeprazole refilled.

## 2016-10-09 ENCOUNTER — Telehealth: Payer: Self-pay | Admitting: Gastroenterology

## 2016-10-09 NOTE — Telephone Encounter (Signed)
Hi William Watson, Can you please let this patient know this medication is not one we usually refill. Have his symptoms recurred? Any clarification would be appreciated. Thanks

## 2016-10-09 NOTE — Telephone Encounter (Signed)
Dr. Adela LankArmbruster, Do you want to see this pt back in clinic?  He is requesting a refill of Diflucan.

## 2016-10-10 NOTE — Telephone Encounter (Signed)
Okay; thanks.

## 2016-10-10 NOTE — Telephone Encounter (Signed)
Pt states that his throat is very dry and it makes it hard to swallow. He denies any white patches. He does want to be seen. I scheduled him for 11/06/16.

## 2016-10-11 NOTE — Telephone Encounter (Signed)
Left message for pt informing that Dr. Adela LankArmbruster would like to see him before prescribing Diflucan again. Instructed to return call if the June 19th appt does not work for him.

## 2016-10-30 DIAGNOSIS — M1000 Idiopathic gout, unspecified site: Secondary | ICD-10-CM | POA: Diagnosis not present

## 2016-10-30 DIAGNOSIS — K219 Gastro-esophageal reflux disease without esophagitis: Secondary | ICD-10-CM | POA: Diagnosis not present

## 2016-10-30 DIAGNOSIS — E784 Other hyperlipidemia: Secondary | ICD-10-CM | POA: Diagnosis not present

## 2016-10-30 DIAGNOSIS — I10 Essential (primary) hypertension: Secondary | ICD-10-CM | POA: Diagnosis not present

## 2016-11-06 ENCOUNTER — Encounter (INDEPENDENT_AMBULATORY_CARE_PROVIDER_SITE_OTHER): Payer: Self-pay

## 2016-11-06 ENCOUNTER — Ambulatory Visit (INDEPENDENT_AMBULATORY_CARE_PROVIDER_SITE_OTHER): Payer: BLUE CROSS/BLUE SHIELD | Admitting: Gastroenterology

## 2016-11-06 ENCOUNTER — Encounter: Payer: Self-pay | Admitting: Gastroenterology

## 2016-11-06 ENCOUNTER — Other Ambulatory Visit: Payer: BLUE CROSS/BLUE SHIELD

## 2016-11-06 VITALS — BP 166/88 | HR 100 | Ht 70.0 in | Wt 182.6 lb

## 2016-11-06 DIAGNOSIS — B1910 Unspecified viral hepatitis B without hepatic coma: Secondary | ICD-10-CM

## 2016-11-06 DIAGNOSIS — F458 Other somatoform disorders: Secondary | ICD-10-CM | POA: Diagnosis not present

## 2016-11-06 DIAGNOSIS — R198 Other specified symptoms and signs involving the digestive system and abdomen: Secondary | ICD-10-CM

## 2016-11-06 DIAGNOSIS — K219 Gastro-esophageal reflux disease without esophagitis: Secondary | ICD-10-CM

## 2016-11-06 DIAGNOSIS — R0989 Other specified symptoms and signs involving the circulatory and respiratory systems: Secondary | ICD-10-CM

## 2016-11-06 DIAGNOSIS — B181 Chronic viral hepatitis B without delta-agent: Secondary | ICD-10-CM | POA: Diagnosis not present

## 2016-11-06 DIAGNOSIS — B3781 Candidal esophagitis: Secondary | ICD-10-CM

## 2016-11-06 LAB — HIV ANTIBODY (ROUTINE TESTING W REFLEX): HIV 1&2 Ab, 4th Generation: NONREACTIVE

## 2016-11-06 MED ORDER — FLUCONAZOLE 200 MG PO TABS: ORAL_TABLET | ORAL | 0 refills | Status: AC

## 2016-11-06 NOTE — Patient Instructions (Signed)
If you are age 55 or older, your body mass index should be between 23-30. Your Body mass index is 26.2 kg/m. If this is out of the aforementioned range listed, please consider follow up with your Primary Care Provider.  If you are age 55 or younger, your body mass index should be between 19-25. Your Body mass index is 26.2 kg/m. If this is out of the aformentioned range listed, please consider follow up with your Primary Care Provider.   We have sent the following medications to your pharmacy for you to pick up at your convenience:  Diflucan  Your physician has requested that you go to the basement for lab work before leaving today.  You will need to return in October to the basement of our building for lab work and you will need to have a follow up ultrasound in October as well. This will be [facility] at [facility] and we will contact you closer to October with an appointment date and time.  Thank you.

## 2016-11-06 NOTE — Progress Notes (Signed)
HPI :  56 year old male with history of hepatitis B, esophageal candidiasis, GERD, dysphagia. Here for a follow-up visit.  He initially saw me in April of this year for symptoms of regurgitation, pyrosis, dysphagia, globus. EGD was done on 09/14/16 - 2cm hiatal hernia, esophageal candidiasis, empiric dilation to 18mm, normal stomach and duodenum  Treated with diflucan and increased omeprazole to twice daily. Following treatment with Diflucan he reported symptomatic benefit of the symptoms. He reports his heartburn is better controlled on the higher dose omeprazole. However he feels a sense of globus which has been bothering him. He thinks it comes and goes, bothers him mostly at night, feels dryness in his throat. He denies any dysphagia. He is eating okay, no nausea or vomiting. He reported diflucan helped these symptoms when he took it and is concerned about recurrent candida infection. He thinks within 1-2 weeks after stopping the fluconazole some of these symptoms came back. He denies any antibiotic use. He denies any steroid use.   He had hepatitis B labs drawn otherwise since his last visit, as he had never had treatment for chronic hepatitis B - results showed has chronic inactive hepatitis B (hep E AG negative, hep E AB positive), DNA level < 2000 (633), and ALT was normal.  AFP was mildly elevated, [place] on 09/09/16 was normal.  Prior colonoscopy in 2015, no result on file.    Past Medical History:  Diagnosis Date  . GERD (gastroesophageal reflux disease)   . Hepatitis B   . Hyperlipidemia   . Hypertension      History reviewed. No pertinent surgical history. History reviewed. No pertinent family history. Social History  Substance Use Topics  . Smoking status: Never Smoker  . Smokeless tobacco: Never Used  . Alcohol use No   Current Outpatient Prescriptions  Medication Sig Dispense Refill  . acetaminophen (TYLENOL) 500 MG tablet Take 1,000 mg by mouth every 6 (six) hours as  needed for mild pain or moderate pain.    Marland Kitchen allopurinol (ZYLOPRIM) 100 MG tablet TK 1 T PO  QD    . cyclobenzaprine (FLEXERIL) 5 MG tablet TK 1 T PO QHS PRN    . fluconazole (DIFLUCAN) 200 MG tablet Take 2 tablets by mouth on day one then take 1 tablet daily for 13 days. 15 tablet 0  . hydrochlorothiazide (HYDRODIURIL) 25 MG tablet Take 1 tablet (25 mg total) by mouth daily. 30 tablet 0  . lisinopril (PRINIVIL,ZESTRIL) 10 MG tablet Take 2 tablets (20 mg total) by mouth daily. 30 tablet 0  . meclizine (ANTIVERT) 32 MG tablet Take 1 tablet (32 mg total) by mouth 3 (three) times daily as needed. 30 tablet 0  . meloxicam (MOBIC) 15 MG tablet Take 15 mg by mouth daily. with food  5  . omeprazole (PRILOSEC) 40 MG capsule Take 1 capsule (40 mg total) by mouth 2 (two) times daily. 90 capsule 1  . ondansetron (ZOFRAN) 4 MG tablet Take 1 tablet (4 mg total) by mouth every 6 (six) hours. 12 tablet 0  . valsartan-hydrochlorothiazide (DIOVAN-HCT) 80-12.5 MG per tablet Take 1 tablet by mouth daily.     Current Facility-Administered Medications  Medication Dose Route Frequency Provider Last Rate Last Dose  . 0.9 %  sodium chloride infusion  500 mL Intravenous Continuous Elijha Dedman, Reeves Forth, MD       No Known Allergies   Review of Systems: All systems reviewed and negative except where noted in HPI.   Lab Results  Component  Value Date   WBC 3.9 (L) 08/24/2016   HGB 14.8 08/24/2016   HCT 44.3 08/24/2016   MCV 88.7 08/24/2016   PLT 269.0 08/24/2016    Lab Results  Component Value Date   CREATININE 1.10 09/02/2014   BUN 16 09/02/2014   NA 138 09/02/2014   K 4.7 09/02/2014   CL 102 09/02/2014   CO2 30 09/02/2014    Lab Results  Component Value Date   ALT 26 08/24/2016   AST 26 08/24/2016   ALKPHOS 79 08/24/2016   BILITOT 0.5 08/24/2016     Physical Exam: BP (!) 166/88   Pulse 100   Ht 5\' 10"  (1.778 m)   Wt 182 lb 9.6 oz (82.8 kg)   BMI 26.20 kg/m  Constitutional:  Pleasant,well-developed, male in no acute distress. HEENT: Normocephalic and atraumatic. Conjunctivae are normal. No scleral icterus. Neck supple.  Cardiovascular: Normal rate, regular rhythm.  Pulmonary/chest: Effort normal and breath sounds normal. No wheezing, rales or rhonchi. Abdominal: Soft, nondistended, nontender. There are no masses palpable. No hepatomegaly. Extremities: no edema Lymphadenopathy: No cervical adenopathy noted. Neurological: Alert and oriented to person place and time. Skin: Skin is warm and dry. No rashes noted. Psychiatric: Normal mood and affect. Behavior is normal.   ASSESSMENT AND PLAN: 56 year old male here for reassessment of the following issues:  Globus / GERD / history of esophageal candidiasis - overall reflux appears better controlled on omeprazole. He reports his globus and sense of dysphagia had improved significantly with Diflucan treatment, no globus has returned, he thinks another course of fluconazole provide benefit. I discussed that only way to truly tell if he has recurrent candidiasis to perform another endoscopy, however given cost and risk associated with this we'll give him another empiric course for 2 weeks to see if this helps. If his symptoms fail to improve with this I think it may be unlikely that he has recurrent candidiasis. I'm recommending an HIV test given his history of candidiasis without clear risk factors otherwise. If his symptoms persist despite treatment we may consider esophageal manometry and 24-hour pH testing. He'll continue his omeprazole for now.  Inactive chronic hepatitis B - (Hep B E Ag negative, Hep B E AB positive, DNA 633, ALT normal) - per 2018 AASLD guidelines, given his labs with DNA < 2000 and his ALT being normal, NO treatment is recommended at this time. Recommend repeating LFTs with hepatitis B DNA levels in 6 months. He will need US every [facility] - recall US of the liver to be done in October  2018. He agreed.   Ileene PatrickSteven Malai Lady, MD [facility] Pager [phone]

## 2016-11-15 ENCOUNTER — Telehealth: Payer: Self-pay | Admitting: Gastroenterology

## 2016-11-15 NOTE — Telephone Encounter (Signed)
Tried calling pt again. Mailbox is full.

## 2016-12-01 ENCOUNTER — Other Ambulatory Visit: Payer: Self-pay | Admitting: Gastroenterology

## 2016-12-03 ENCOUNTER — Telehealth: Payer: Self-pay | Admitting: Gastroenterology

## 2016-12-03 NOTE — Telephone Encounter (Signed)
Patient given copy of lab results and all documentation that we did try to contact him by phone with these results.

## 2017-01-30 ENCOUNTER — Telehealth: Payer: Self-pay | Admitting: *Deleted

## 2017-01-30 DIAGNOSIS — B181 Chronic viral hepatitis B without delta-agent: Secondary | ICD-10-CM

## 2017-01-30 NOTE — Telephone Encounter (Signed)
Patient is due for limited abdominal ultrasound of liver for [facility] (patient with chronic inactive hepatitis B) and repeat labwork in October 2018. Patient has been scheduled for an limited abdominal ultrasound at [facility] on Friday, 03/01/17 at 9:00 am with an 8:45 am arrival. Nothing to eat or drink 6 hours prior. Patient also needs to have labwork completed that day on our basement floor. Orders are already in EPIC. I attempted to call patient x 2 but got no answer and voicemail is full. I will attempt to call patient back at a later time.

## 2017-01-31 NOTE — Telephone Encounter (Signed)
I have attempted to call patient but got no answer and voicemail is full. I will attempt to call back at a later time.

## 2017-02-04 NOTE — Telephone Encounter (Signed)
I have spoken to the patient to advise that he is scheduled for an abdominal ultrasound at [facility] on 03/01/17 at 9 am with 845 arrival and that he should be npo 6 hours prior. I have also advised that he should come to our office lab same day for labwork and that orders have already been placed in our computer system. Patient verbalizes understanding of times/dates/locations/prep of appointments.

## 2017-02-04 NOTE — Telephone Encounter (Signed)
I have spoken with male and have asked that she have patient call our office back. She has taken our number and states she will have him to call us back.

## 2017-02-20 ENCOUNTER — Other Ambulatory Visit: Payer: Self-pay

## 2017-03-01 ENCOUNTER — Other Ambulatory Visit (INDEPENDENT_AMBULATORY_CARE_PROVIDER_SITE_OTHER): Payer: BLUE CROSS/BLUE SHIELD

## 2017-03-01 ENCOUNTER — Ambulatory Visit (HOSPITAL_COMMUNITY)
Admission: RE | Admit: 2017-03-01 | Discharge: 2017-03-01 | Disposition: A | Discharge status: Home / Self Care | Payer: BLUE CROSS/BLUE SHIELD | Source: Ambulatory Visit | Attending: Gastroenterology | Admitting: Gastroenterology

## 2017-03-01 DIAGNOSIS — K760 Fatty (change of) liver, not elsewhere classified: Secondary | ICD-10-CM | POA: Diagnosis not present

## 2017-03-01 DIAGNOSIS — B181 Chronic viral hepatitis B without delta-agent: Secondary | ICD-10-CM | POA: Insufficient documentation

## 2017-03-01 LAB — HEPATIC FUNCTION PANEL
ALBUMIN: 4.5 g/dL (ref 3.5–5.2)
ALK PHOS: 85 U/L (ref 39–117)
ALT: 20 U/L (ref 0–53)
AST: 22 U/L (ref 0–37)
Bilirubin, Direct: 0.1 mg/dL (ref 0.0–0.3)
Total Bilirubin: 0.4 mg/dL (ref 0.2–1.2)
Total Protein: 7.7 g/dL (ref 6.0–8.3)

## 2017-03-05 LAB — HEPATITIS B DNA, ULTRAQUANTITATIVE, PCR
HEPATITIS B DNA (CALC): 3.51 {Log_IU}/mL — AB
Hepatitis B DNA: 3200 IU/mL — ABNORMAL HIGH

## 2017-03-06 ENCOUNTER — Other Ambulatory Visit: Payer: Self-pay

## 2017-03-06 DIAGNOSIS — B1910 Unspecified viral hepatitis B without hepatic coma: Secondary | ICD-10-CM

## 2017-04-16 DIAGNOSIS — M545 Low back pain: Secondary | ICD-10-CM | POA: Diagnosis not present

## 2017-04-16 DIAGNOSIS — K648 Other hemorrhoids: Secondary | ICD-10-CM | POA: Diagnosis not present

## 2017-04-16 DIAGNOSIS — I10 Essential (primary) hypertension: Secondary | ICD-10-CM | POA: Diagnosis not present

## 2017-05-16 ENCOUNTER — Ambulatory Visit: Payer: BLUE CROSS/BLUE SHIELD | Admitting: Gastroenterology

## 2017-05-23 DIAGNOSIS — I10 Essential (primary) hypertension: Secondary | ICD-10-CM | POA: Diagnosis not present

## 2017-05-23 DIAGNOSIS — K5909 Other constipation: Secondary | ICD-10-CM | POA: Diagnosis not present

## 2017-05-23 DIAGNOSIS — M545 Low back pain: Secondary | ICD-10-CM | POA: Diagnosis not present

## 2017-05-23 DIAGNOSIS — E7849 Other hyperlipidemia: Secondary | ICD-10-CM | POA: Diagnosis not present

## 2017-06-04 ENCOUNTER — Telehealth: Payer: Self-pay

## 2017-06-04 NOTE — Telephone Encounter (Signed)
-----   Message from Leverne HumblesJulia M Fournier, RN sent at 03/06/2017  2:07 PM EDT ----- Remind pt to come get repeat LFT and HBV DNA, due January, order in Epic.

## 2017-06-04 NOTE — Telephone Encounter (Signed)
Left message for patient to come get labs repeated. Order placed.

## 2017-07-10 ENCOUNTER — Ambulatory Visit: Payer: BLUE CROSS/BLUE SHIELD | Admitting: Gastroenterology

## 2017-07-18 DIAGNOSIS — I10 Essential (primary) hypertension: Secondary | ICD-10-CM | POA: Diagnosis not present

## 2017-07-18 DIAGNOSIS — K219 Gastro-esophageal reflux disease without esophagitis: Secondary | ICD-10-CM | POA: Diagnosis not present

## 2017-07-18 DIAGNOSIS — Z125 Encounter for screening for malignant neoplasm of prostate: Secondary | ICD-10-CM | POA: Diagnosis not present

## 2017-07-18 DIAGNOSIS — M1000 Idiopathic gout, unspecified site: Secondary | ICD-10-CM | POA: Diagnosis not present

## 2017-07-18 DIAGNOSIS — E7849 Other hyperlipidemia: Secondary | ICD-10-CM | POA: Diagnosis not present

## 2017-07-18 DIAGNOSIS — Z131 Encounter for screening for diabetes mellitus: Secondary | ICD-10-CM | POA: Diagnosis not present

## 2017-08-21 ENCOUNTER — Ambulatory Visit (INDEPENDENT_AMBULATORY_CARE_PROVIDER_SITE_OTHER): Payer: BLUE CROSS/BLUE SHIELD | Admitting: Gastroenterology

## 2017-08-21 ENCOUNTER — Other Ambulatory Visit (INDEPENDENT_AMBULATORY_CARE_PROVIDER_SITE_OTHER): Payer: BLUE CROSS/BLUE SHIELD

## 2017-08-21 ENCOUNTER — Encounter: Payer: Self-pay | Admitting: Gastroenterology

## 2017-08-21 VITALS — BP 136/80 | HR 80 | Ht 70.0 in | Wt 178.0 lb

## 2017-08-21 DIAGNOSIS — B3781 Candidal esophagitis: Secondary | ICD-10-CM | POA: Diagnosis not present

## 2017-08-21 DIAGNOSIS — K219 Gastro-esophageal reflux disease without esophagitis: Secondary | ICD-10-CM

## 2017-08-21 DIAGNOSIS — K5900 Constipation, unspecified: Secondary | ICD-10-CM

## 2017-08-21 DIAGNOSIS — B181 Chronic viral hepatitis B without delta-agent: Secondary | ICD-10-CM

## 2017-08-21 LAB — HEPATIC FUNCTION PANEL
ALBUMIN: 4.3 g/dL (ref 3.5–5.2)
ALK PHOS: 90 U/L (ref 39–117)
ALT: 15 U/L (ref 0–53)
AST: 23 U/L (ref 0–37)
BILIRUBIN DIRECT: 0.1 mg/dL (ref 0.0–0.3)
TOTAL PROTEIN: 7.5 g/dL (ref 6.0–8.3)
Total Bilirubin: 0.4 mg/dL (ref 0.2–1.2)

## 2017-08-21 MED ORDER — LINACLOTIDE 145 MCG PO CAPS: 145.0000 ug | ORAL_CAPSULE | Freq: Every day | ORAL | 0 refills | Status: AC

## 2017-08-21 NOTE — Progress Notes (Signed)
HPI :  57 year old male here for reassessment:  He has chronic inactive hepatitis B - historically Hep B E Ag negative, Hep B E AB positive, DNA 633, ALT normal.  His most recent DNA level had risen to 3200 about 6 months ago.  At that time I had recommended a follow-up lab in 3 months which he was not able to do.  He states he is generally feeling well without any significant complaints today.  We discussed hepatitis B in general and associated risks of cirrhosis and liver cancer.  His last ultrasound was in October 2018 which showed fatty liver.  He denies any alcohol use.  He had a colonoscopy in  Otherwise since his last visit we treated empirically again with fluconazole for possible esophageal candidiasis.  He states this worked to resolve his symptoms of globus.  He is no longer having dysphasia or or problems with that.  He takes omeprazole twice daily for his reflux symptoms which appears to work fairly well for him.  He reports he has been using Linzess a few days a week for constipation, which his primary care has provided to him.  He states this is working well.  He denies any blood in his stools.  2015 with Dr. Dulce Sellar of [facility], we do not have this report.  [place] 03/01/2017 - fatty liver, no pathology otherwise  Prior colonoscopy in 2015, no result on file.   EGD was done on 09/14/16 - 2cm hiatal hernia, esophageal candidiasis, empiric dilation to 18mm, normal stomach and duodenum       Past Medical History:  Diagnosis Date  . GERD (gastroesophageal reflux disease)   . Hepatitis B   . Hyperlipidemia   . Hypertension      History reviewed. No pertinent surgical history. History reviewed. No pertinent family history. Social History   Tobacco Use  . Smoking status: Never Smoker  . Smokeless tobacco: Never Used  Substance Use Topics  . Alcohol use: No  . Drug use: No   Current Outpatient Medications  Medication Sig Dispense Refill  . acetaminophen (TYLENOL) 500 MG  tablet Take 1,000 mg by mouth every 6 (six) hours as needed for mild pain or moderate pain.    Marland Kitchen allopurinol (ZYLOPRIM) 100 MG tablet TK 1 T PO  QD    . cyclobenzaprine (FLEXERIL) 5 MG tablet TK 1 T PO QHS PRN    . fluconazole (DIFLUCAN) 200 MG tablet Take 2 tablets by mouth on day one then take 1 tablet daily for 13 days. 15 tablet 0  . hydrochlorothiazide (HYDRODIURIL) 25 MG tablet Take 1 tablet (25 mg total) by mouth daily. 30 tablet 0  . lisinopril (PRINIVIL,ZESTRIL) 10 MG tablet Take 2 tablets (20 mg total) by mouth daily. 30 tablet 0  . meclizine (ANTIVERT) 32 MG tablet Take 1 tablet (32 mg total) by mouth 3 (three) times daily as needed. 30 tablet 0  . meloxicam (MOBIC) 15 MG tablet Take 15 mg by mouth daily. with food  5  . omeprazole (PRILOSEC) 40 MG capsule Take 1 capsule (40 mg total) by mouth 2 (two) times daily. 90 capsule 1  . ondansetron (ZOFRAN) 4 MG tablet Take 1 tablet (4 mg total) by mouth every 6 (six) hours. 12 tablet 0  . valsartan-hydrochlorothiazide (DIOVAN-HCT) 80-12.5 MG per tablet Take 1 tablet by mouth daily.     Current Facility-Administered Medications  Medication Dose Route Frequency Provider Last Rate Last Dose  . 0.9 %  sodium chloride  infusion  500 mL Intravenous Continuous Armbruster, Willaim RayasSteven P, MD       No Known Allergies   Review of Systems: All systems reviewed and negative except where noted in HPI.   Lab Results  Component Value Date   WBC 3.9 (L) 08/24/2016   HGB 14.8 08/24/2016   HCT 44.3 08/24/2016   MCV 88.7 08/24/2016   PLT 269.0 08/24/2016    Lab Results  Component Value Date   CREATININE 1.10 09/02/2014   BUN 16 09/02/2014   NA 138 09/02/2014   K 4.7 09/02/2014   CL 102 09/02/2014   CO2 30 09/02/2014    Lab Results  Component Value Date   ALT 15 08/21/2017   AST 23 08/21/2017   ALKPHOS 90 08/21/2017   BILITOT 0.4 08/21/2017   No results found for: AFP   Physical Exam: BP 136/80 (BP Location: Left Arm, Patient Position:  Sitting, Cuff Size: Normal)   Pulse 80   Ht 5\' 10"  (1.778 m)   Wt 178 lb (80.7 kg)   BMI 25.54 kg/m  Constitutional: Pleasant,well-developed, male in no acute distress. HEENT: Normocephalic and atraumatic. Conjunctivae are normal. No scleral icterus. Neck supple.  Cardiovascular: Normal rate, regular rhythm.  Pulmonary/chest: Effort normal and breath sounds normal. No wheezing, rales or rhonchi. Abdominal: Soft, nondistended, nontender.  There are no masses palpable. No hepatomegaly. Extremities: no edema Lymphadenopathy: No cervical adenopathy noted. Neurological: Alert and oriented to person place and time. Skin: Skin is warm and dry. No rashes noted. Psychiatric: Normal mood and affect. Behavior is normal.   ASSESSMENT AND PLAN: 57 year old male here for reassessment the following issues:  Chronic inactive hepatitis B - I discussed  long-term risks for cirrhosis and hepatocellular carcinoma.  Guidelines recommend against treatment if DNA level is less than 2000.  Historically his level has been low although on last draw it was 3000.  He is overdue for having this repeated, will check that today as well as his LFTs.  We will also check to ensure he has been immunized to hepatitis A. he is due for repeat HCC screening with an ultrasound and AFP level.  If he continues to not warrant therapy for hepatitis B, he must have labs at least every 6 months surveillance of this issue.  He agreed.  Esophageal candidiasis - resolved after 2 courses of fluconazole.  HIV negative.  Follow-up as needed for recurrent symptoms.  Constipation - samples provided for Linzess which appear to work well for him.  He can continue this as needed.  Will again attempt to get report on last colonoscopy for our records.  Ileene PatrickSteven Armbruster, MD [facility]

## 2017-08-21 NOTE — Patient Instructions (Addendum)
If you are age 56 or older, your body mass index should be between 23-30. Your Body mass index is 25.54 kg/m. If this is out of the aforementioned range listed, please consider follow up with your Primary Care Provider.  If you are age 56 or younger, your body mass index should be between 19-25. Your Body mass index is 25.54 kg/m. If this is out of the aformentioned range listed, please consider follow up with your Primary Care Provider.   You have been scheduled for an abdominal ultrasound at [facility] (1st floor of hospital) on Tuesday, 08-27-17 at 9:00am. Please arrive 15 minutes prior to your appointment for registration. Make certain not to have anything to eat or drink 6 hours prior to your appointment. Should you need to reschedule your appointment, please contact radiology at [phone]. This test typically takes about 30 minutes to perform.  Please go to the lab in the basement of our building to have lab work done as you leave today.  We have given you samples of the following medication to take: Linzess 145 mcg  We will [facility].  Thank you for entrusting me with your care and for choosing [facility], Dr. Ileene PatrickSteven Armbruster

## 2017-08-23 ENCOUNTER — Other Ambulatory Visit: Payer: Self-pay

## 2017-08-24 LAB — HEPATITIS B DNA, ULTRAQUANTITATIVE, PCR
HEPATITIS B DNA: 514 [IU]/mL — AB
Hepatitis B DNA (Calc): 2.71 Log IU/mL — ABNORMAL HIGH

## 2017-08-24 LAB — AFP TUMOR MARKER: AFP TUMOR MARKER: 6.5 ng/mL — AB (ref <6.1)

## 2017-08-24 LAB — HEPATITIS A ANTIBODY, TOTAL: Hepatitis A AB,Total: REACTIVE — AB

## 2017-08-27 ENCOUNTER — Ambulatory Visit (HOSPITAL_COMMUNITY): Payer: BLUE CROSS/BLUE SHIELD

## 2017-08-28 NOTE — Progress Notes (Signed)
Unable to reach pt by phone with lab results.  Letter sent

## 2017-08-29 ENCOUNTER — Telehealth: Payer: Self-pay | Admitting: Gastroenterology

## 2017-08-29 ENCOUNTER — Ambulatory Visit (HOSPITAL_COMMUNITY)
Admission: RE | Admit: 2017-08-29 | Discharge: 2017-08-29 | Disposition: A | Discharge status: Home / Self Care | Payer: BLUE CROSS/BLUE SHIELD | Source: Ambulatory Visit | Attending: Gastroenterology | Admitting: Gastroenterology

## 2017-08-29 DIAGNOSIS — K5900 Constipation, unspecified: Secondary | ICD-10-CM

## 2017-08-29 DIAGNOSIS — B3781 Candidal esophagitis: Secondary | ICD-10-CM | POA: Diagnosis not present

## 2017-08-29 DIAGNOSIS — B181 Chronic viral hepatitis B without delta-agent: Secondary | ICD-10-CM

## 2017-08-29 DIAGNOSIS — B182 Chronic viral hepatitis C: Secondary | ICD-10-CM | POA: Diagnosis not present

## 2017-08-29 NOTE — Telephone Encounter (Signed)
Records received:  Colonoscopy 08/23/2014 - Dr. Dulce Sellarutlaw - 10mm submucosal nodule in the proximal transverse colon. 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   Dr. Dulce Sellarutlaw had recommended a repeat colonoscopy in 3 years to assess for interval change which is reasonable. In that light he is now due for a colonoscopy.   Can you help schedule a colonoscopy if he is willing to proceed?  Thanks much

## 2017-08-29 NOTE — Telephone Encounter (Signed)
Phone call to patient, his voicemail box is full and not accepting messages.

## 2017-08-30 ENCOUNTER — Encounter: Payer: Self-pay | Admitting: Gastroenterology

## 2017-08-30 NOTE — Telephone Encounter (Signed)
Letter sent from Dr Adela LankArmbruster.

## 2017-12-26 DIAGNOSIS — I10 Essential (primary) hypertension: Secondary | ICD-10-CM | POA: Diagnosis not present

## 2017-12-26 DIAGNOSIS — L03012 Cellulitis of left finger: Secondary | ICD-10-CM | POA: Diagnosis not present

## 2017-12-31 DIAGNOSIS — E7849 Other hyperlipidemia: Secondary | ICD-10-CM | POA: Diagnosis not present

## 2017-12-31 DIAGNOSIS — K5909 Other constipation: Secondary | ICD-10-CM | POA: Diagnosis not present

## 2017-12-31 DIAGNOSIS — I10 Essential (primary) hypertension: Secondary | ICD-10-CM | POA: Diagnosis not present

## 2017-12-31 DIAGNOSIS — K219 Gastro-esophageal reflux disease without esophagitis: Secondary | ICD-10-CM | POA: Diagnosis not present

## 2018-02-10 IMAGING — US US ABDOMEN LIMITED
1 series · 14 of 25 positions shown · non-contrast
Comparison: 08/30/2016

CLINICAL DATA: Chronic viral hepatitis-B

EXAM:
ULTRASOUND ABDOMEN LIMITED RIGHT UPPER QUADRANT

[Series 1: us abdomen limited · 0.17mm/px · 14 of 25 slices shown]
[im 1/25]
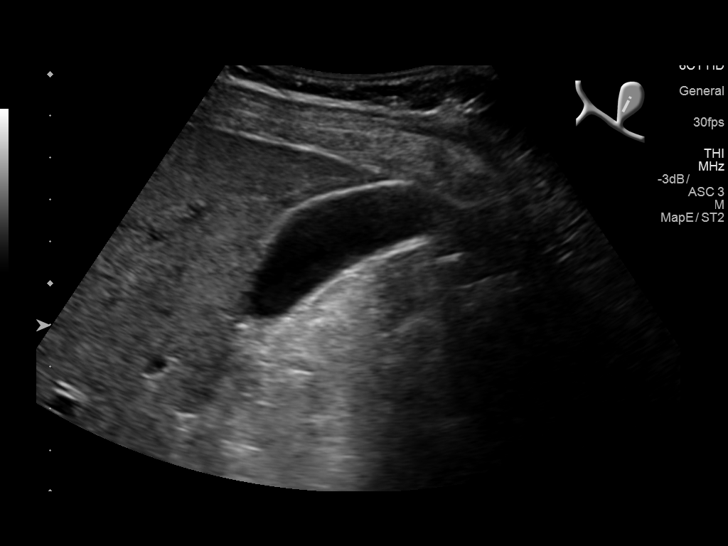
[im 3/25]
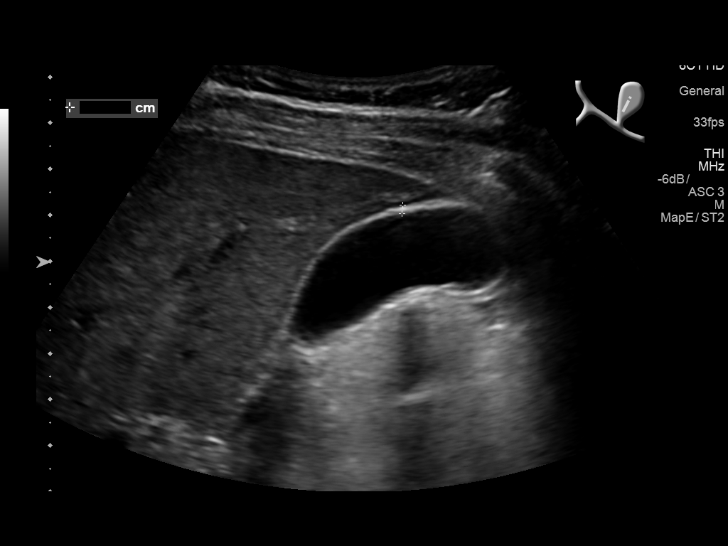
[im 5/25]
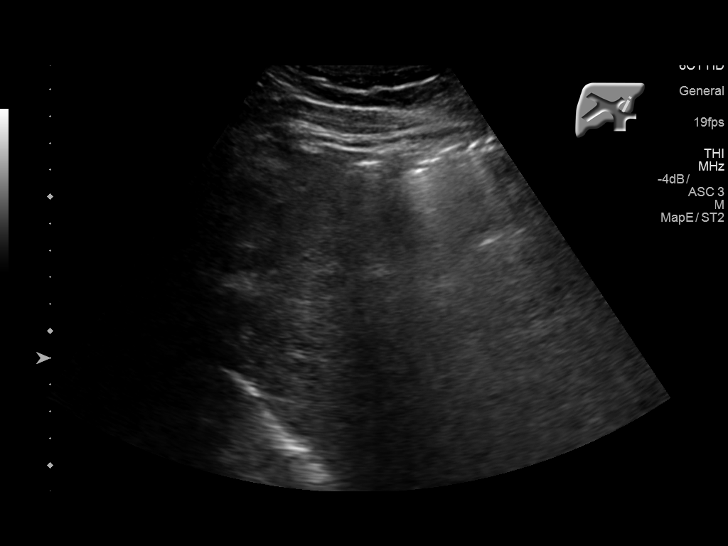
[im 7/25]
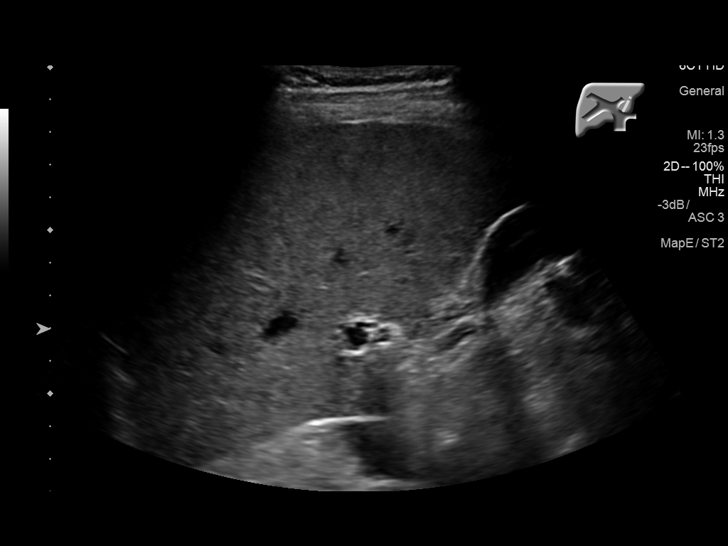
[im 9/25]
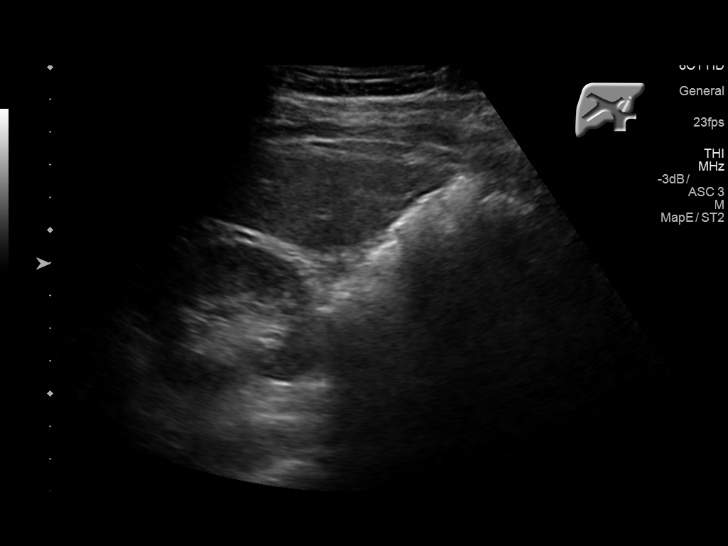
[im 10/25]
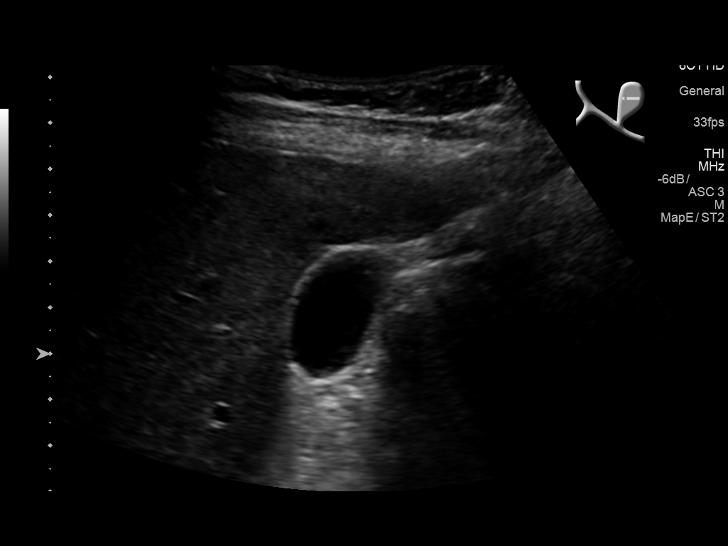
[im 12/25]
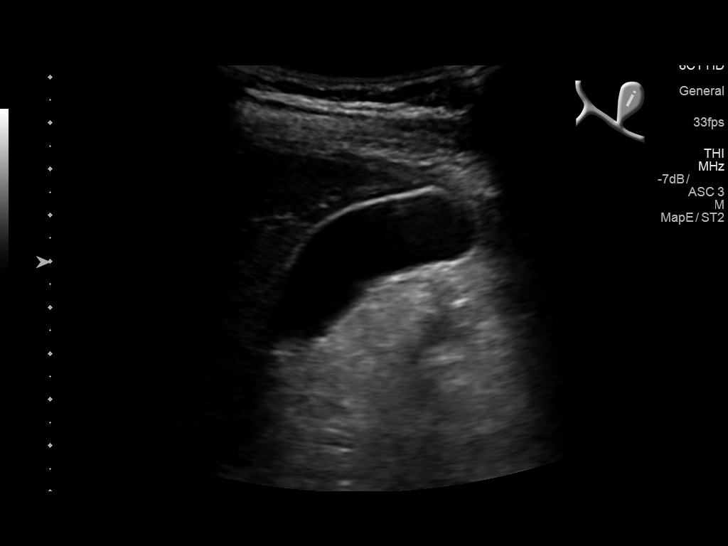
[im 14/25]
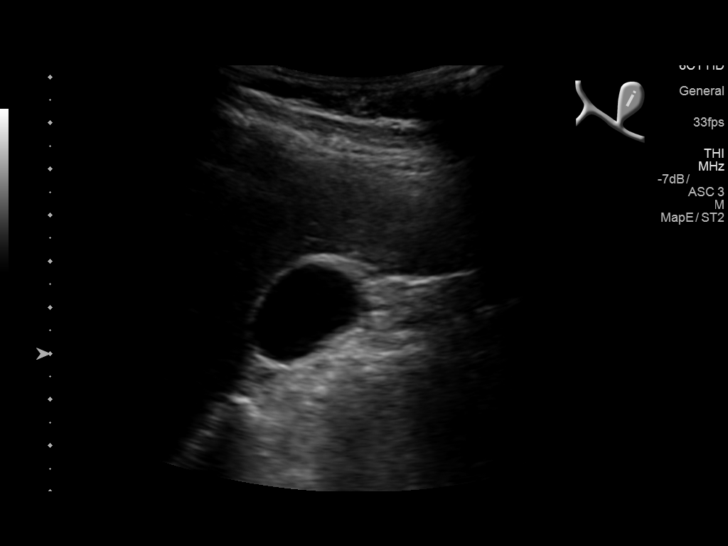
[im 16/25]
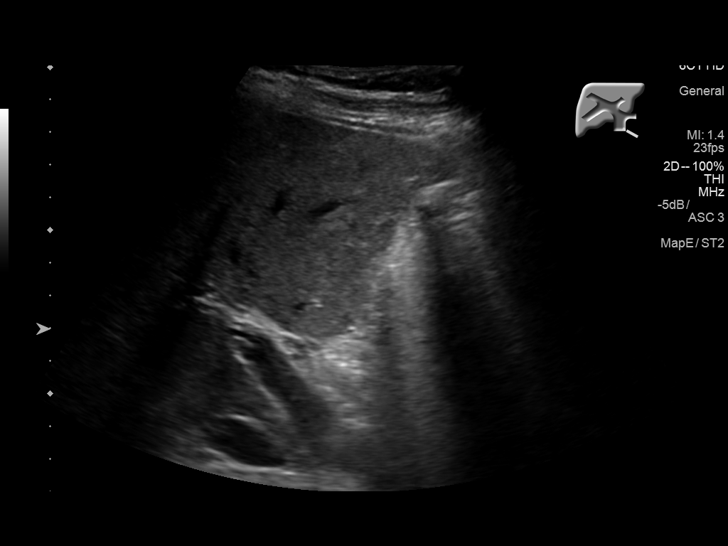
[im 17/25]
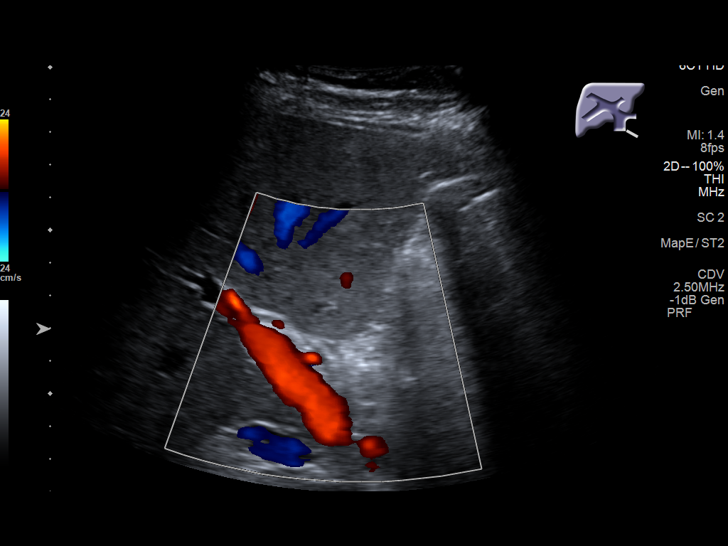
[im 19/25]
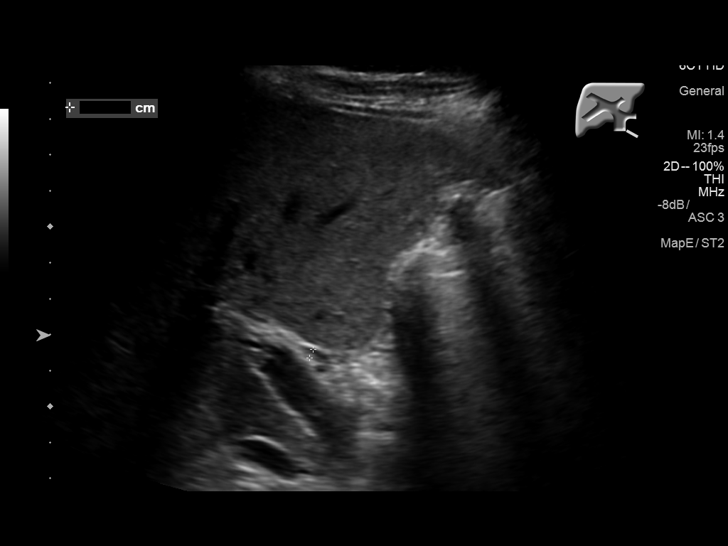
[im 21/25]
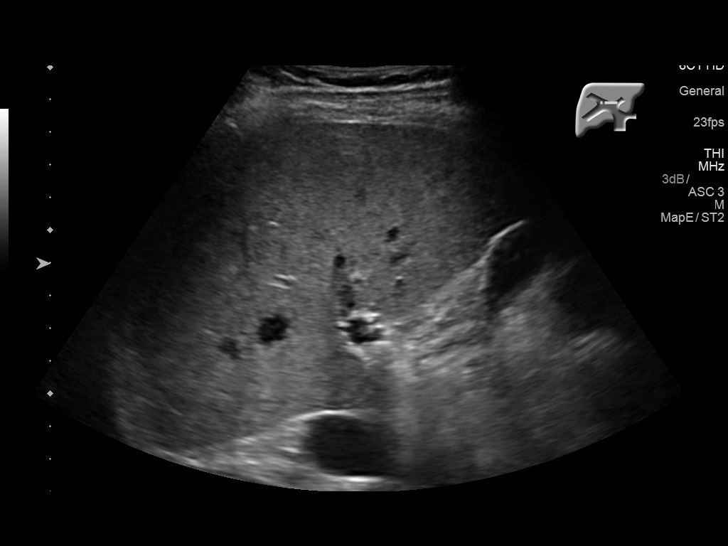
[im 23/25]
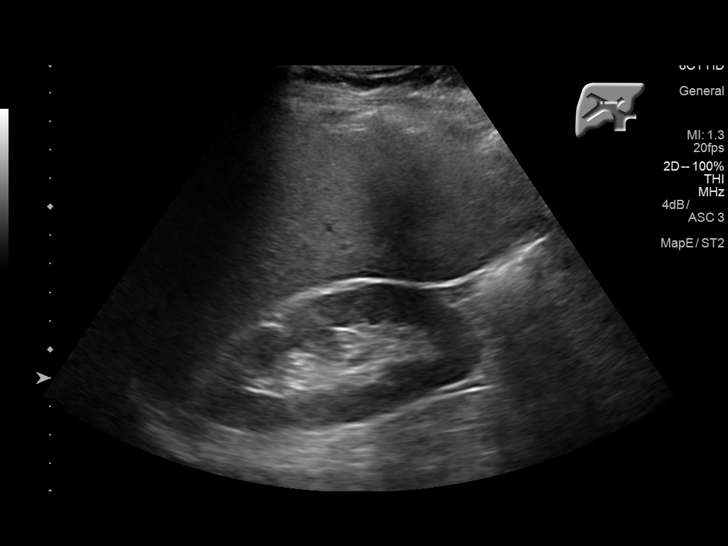
[im 25/25]
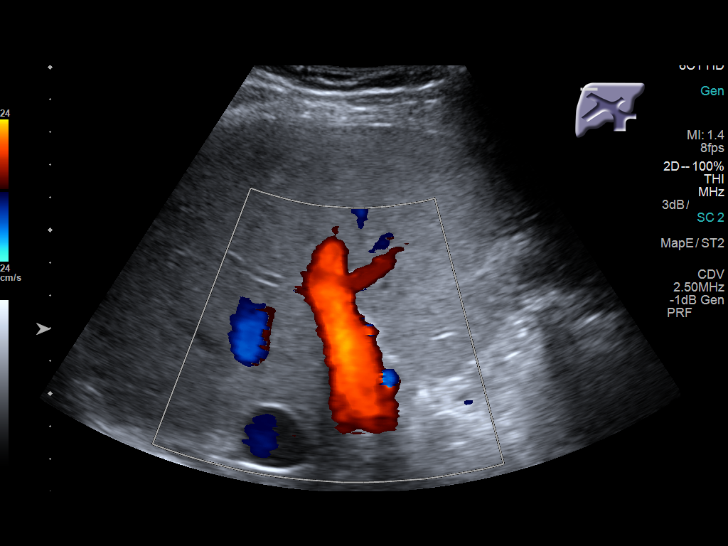

[14 of 25 positions shown; findings below may reference images not displayed]

FINDINGS: Gallbladder:

No gallstones or wall thickening visualized. No sonographic Murphy
sign noted by sonographer.

Common bile duct:

Diameter: Normal caliber, 3 mm

Liver:

Increased echotexture compatible with fatty infiltration. No focal
abnormality or biliary ductal dilatation. Portal vein is patent on
color Doppler imaging with normal direction of blood flow towards
the liver.
IMPRESSION: Fatty infiltration of the liver.  No acute findings.

## 2018-02-27 ENCOUNTER — Telehealth: Payer: Self-pay

## 2018-02-27 DIAGNOSIS — B181 Chronic viral hepatitis B without delta-agent: Secondary | ICD-10-CM

## 2018-02-27 DIAGNOSIS — B1910 Unspecified viral hepatitis B without hepatic coma: Secondary | ICD-10-CM

## 2018-02-27 NOTE — Telephone Encounter (Signed)
-----   Message from Cooper Render, CMA sent at 08/27/2017  9:34 AM EDT ----- Regarding: labs due hepatitis B DNA, LFTs, AFP

## 2018-02-27 NOTE — Telephone Encounter (Signed)
Called pt. Could not leave message as Voicemail was full. Sent letter

## 2018-04-01 DIAGNOSIS — K5909 Other constipation: Secondary | ICD-10-CM | POA: Diagnosis not present

## 2018-04-01 DIAGNOSIS — I10 Essential (primary) hypertension: Secondary | ICD-10-CM | POA: Diagnosis not present

## 2018-04-01 DIAGNOSIS — K219 Gastro-esophageal reflux disease without esophagitis: Secondary | ICD-10-CM | POA: Diagnosis not present

## 2018-04-01 DIAGNOSIS — B353 Tinea pedis: Secondary | ICD-10-CM | POA: Diagnosis not present

## 2018-04-25 DIAGNOSIS — M79671 Pain in right foot: Secondary | ICD-10-CM | POA: Diagnosis not present

## 2018-04-25 DIAGNOSIS — B353 Tinea pedis: Secondary | ICD-10-CM | POA: Diagnosis not present

## 2018-04-25 DIAGNOSIS — L600 Ingrowing nail: Secondary | ICD-10-CM | POA: Diagnosis not present

## 2018-04-25 DIAGNOSIS — B351 Tinea unguium: Secondary | ICD-10-CM | POA: Diagnosis not present

## 2018-04-25 DIAGNOSIS — M79672 Pain in left foot: Secondary | ICD-10-CM | POA: Diagnosis not present

## 2018-05-08 DIAGNOSIS — M792 Neuralgia and neuritis, unspecified: Secondary | ICD-10-CM | POA: Diagnosis not present

## 2018-05-08 DIAGNOSIS — G609 Hereditary and idiopathic neuropathy, unspecified: Secondary | ICD-10-CM | POA: Diagnosis not present

## 2018-06-16 DIAGNOSIS — B351 Tinea unguium: Secondary | ICD-10-CM | POA: Diagnosis not present

## 2018-06-20 DIAGNOSIS — R933 Abnormal findings on diagnostic imaging of other parts of digestive tract: Secondary | ICD-10-CM | POA: Diagnosis not present

## 2018-06-20 DIAGNOSIS — B169 Acute hepatitis B without delta-agent and without hepatic coma: Secondary | ICD-10-CM | POA: Diagnosis not present

## 2018-07-07 DIAGNOSIS — B1910 Unspecified viral hepatitis B without hepatic coma: Secondary | ICD-10-CM | POA: Diagnosis not present

## 2018-07-10 ENCOUNTER — Other Ambulatory Visit: Payer: Self-pay | Admitting: Gastroenterology

## 2018-07-10 DIAGNOSIS — B169 Acute hepatitis B without delta-agent and without hepatic coma: Secondary | ICD-10-CM

## 2018-07-10 DIAGNOSIS — R748 Abnormal levels of other serum enzymes: Secondary | ICD-10-CM

## 2018-07-16 ENCOUNTER — Other Ambulatory Visit: Payer: BLUE CROSS/BLUE SHIELD

## 2018-08-01 ENCOUNTER — Ambulatory Visit
Admission: RE | Admit: 2018-08-01 | Discharge: 2018-08-01 | Disposition: A | Discharge status: Home / Self Care | Payer: BLUE CROSS/BLUE SHIELD | Source: Ambulatory Visit | Attending: Gastroenterology | Admitting: Gastroenterology

## 2018-08-01 ENCOUNTER — Other Ambulatory Visit: Payer: Self-pay

## 2018-08-01 DIAGNOSIS — B169 Acute hepatitis B without delta-agent and without hepatic coma: Secondary | ICD-10-CM

## 2018-08-01 DIAGNOSIS — R748 Abnormal levels of other serum enzymes: Secondary | ICD-10-CM

## 2018-08-01 DIAGNOSIS — B1910 Unspecified viral hepatitis B without hepatic coma: Secondary | ICD-10-CM | POA: Diagnosis not present

## 2018-08-08 DIAGNOSIS — M79675 Pain in left toe(s): Secondary | ICD-10-CM | POA: Diagnosis not present

## 2018-08-08 DIAGNOSIS — L600 Ingrowing nail: Secondary | ICD-10-CM | POA: Diagnosis not present

## 2018-08-08 DIAGNOSIS — M79674 Pain in right toe(s): Secondary | ICD-10-CM | POA: Diagnosis not present

## 2018-08-08 DIAGNOSIS — B351 Tinea unguium: Secondary | ICD-10-CM | POA: Diagnosis not present

## 2018-09-15 DIAGNOSIS — M79674 Pain in right toe(s): Secondary | ICD-10-CM | POA: Diagnosis not present

## 2018-09-15 DIAGNOSIS — M79675 Pain in left toe(s): Secondary | ICD-10-CM | POA: Diagnosis not present

## 2018-09-15 DIAGNOSIS — B351 Tinea unguium: Secondary | ICD-10-CM | POA: Diagnosis not present

## 2018-09-15 DIAGNOSIS — L600 Ingrowing nail: Secondary | ICD-10-CM | POA: Diagnosis not present

## 2018-09-26 DIAGNOSIS — E7849 Other hyperlipidemia: Secondary | ICD-10-CM | POA: Diagnosis not present

## 2018-09-26 DIAGNOSIS — K219 Gastro-esophageal reflux disease without esophagitis: Secondary | ICD-10-CM | POA: Diagnosis not present

## 2018-09-26 DIAGNOSIS — I10 Essential (primary) hypertension: Secondary | ICD-10-CM | POA: Diagnosis not present

## 2018-09-26 DIAGNOSIS — J101 Influenza due to other identified influenza virus with other respiratory manifestations: Secondary | ICD-10-CM | POA: Diagnosis not present

## 2019-03-03 ENCOUNTER — Other Ambulatory Visit: Payer: Self-pay | Admitting: Gastroenterology

## 2019-03-03 DIAGNOSIS — B1910 Unspecified viral hepatitis B without hepatic coma: Secondary | ICD-10-CM

## 2019-03-12 ENCOUNTER — Other Ambulatory Visit: Payer: BLUE CROSS/BLUE SHIELD

## 2019-08-12 ENCOUNTER — Other Ambulatory Visit: Payer: Self-pay | Admitting: Gastroenterology

## 2019-08-12 DIAGNOSIS — B1910 Unspecified viral hepatitis B without hepatic coma: Secondary | ICD-10-CM

## 2019-08-17 ENCOUNTER — Ambulatory Visit
Admission: RE | Admit: 2019-08-17 | Discharge: 2019-08-17 | Disposition: A | Discharge status: Home / Self Care | Payer: BC Managed Care – PPO | Source: Ambulatory Visit | Attending: Gastroenterology | Admitting: Gastroenterology

## 2019-08-17 DIAGNOSIS — B1910 Unspecified viral hepatitis B without hepatic coma: Secondary | ICD-10-CM

## 2021-08-22 ENCOUNTER — Emergency Department (HOSPITAL_COMMUNITY)
Admission: EM | Admit: 2021-08-22 | Discharge: 2021-08-23 | Disposition: A | Discharge status: Home / Self Care | Payer: 59 | Attending: Emergency Medicine | Admitting: Emergency Medicine

## 2021-08-22 ENCOUNTER — Other Ambulatory Visit: Payer: Self-pay

## 2021-08-22 ENCOUNTER — Emergency Department (HOSPITAL_COMMUNITY): Payer: 59

## 2021-08-22 ENCOUNTER — Encounter (HOSPITAL_COMMUNITY): Payer: Self-pay

## 2021-08-22 DIAGNOSIS — M79671 Pain in right foot: Secondary | ICD-10-CM | POA: Insufficient documentation

## 2021-08-22 DIAGNOSIS — Z79899 Other long term (current) drug therapy: Secondary | ICD-10-CM | POA: Insufficient documentation

## 2021-08-22 DIAGNOSIS — M79642 Pain in left hand: Secondary | ICD-10-CM | POA: Insufficient documentation

## 2021-08-22 DIAGNOSIS — R519 Headache, unspecified: Secondary | ICD-10-CM | POA: Insufficient documentation

## 2021-08-22 DIAGNOSIS — Y92410 Unspecified street and highway as the place of occurrence of the external cause: Secondary | ICD-10-CM | POA: Diagnosis not present

## 2021-08-22 DIAGNOSIS — R079 Chest pain, unspecified: Secondary | ICD-10-CM | POA: Diagnosis not present

## 2021-08-22 DIAGNOSIS — I10 Essential (primary) hypertension: Secondary | ICD-10-CM | POA: Insufficient documentation
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
## 2021-08-23 ENCOUNTER — Emergency Department (HOSPITAL_COMMUNITY): Payer: 59

## 2021-08-23 LAB — HEPATIC FUNCTION PANEL
ALT: 48 U/L — ABNORMAL HIGH (ref 0–44)
AST: 44 U/L — ABNORMAL HIGH (ref 15–41)
Albumin: 3.9 g/dL (ref 3.5–5.0)
Alkaline Phosphatase: 93 U/L (ref 38–126)
Bilirubin, Direct: 0.3 mg/dL — ABNORMAL HIGH (ref 0.0–0.2)
Indirect Bilirubin: 0.7 mg/dL (ref 0.3–0.9)
Total Bilirubin: 1 mg/dL (ref 0.3–1.2)
Total Protein: 6.9 g/dL (ref 6.5–8.1)

## 2021-08-23 LAB — CBC WITH DIFFERENTIAL/PLATELET
Abs Immature Granulocytes: 0.01 10*3/uL (ref 0.00–0.07)
Basophils Absolute: 0 10*3/uL (ref 0.0–0.1)
Basophils Relative: 1 %
Eosinophils Absolute: 0.2 10*3/uL (ref 0.0–0.5)
Eosinophils Relative: 4 %
HCT: 50 % (ref 39.0–52.0)
Hemoglobin: 15.9 g/dL (ref 13.0–17.0)
Immature Granulocytes: 0 %
Lymphocytes Relative: 25 %
Lymphs Abs: 1.3 10*3/uL (ref 0.7–4.0)
MCH: 29.4 pg (ref 26.0–34.0)
MCHC: 31.8 g/dL (ref 30.0–36.0)
MCV: 92.6 fL (ref 80.0–100.0)
Monocytes Absolute: 0.6 10*3/uL (ref 0.1–1.0)
Monocytes Relative: 11 %
Neutro Abs: 3.1 10*3/uL (ref 1.7–7.7)
Neutrophils Relative %: 59 %
Platelets: 231 10*3/uL (ref 150–400)
RBC: 5.4 MIL/uL (ref 4.22–5.81)
RDW: 12.3 % (ref 11.5–15.5)
WBC: 5.2 10*3/uL (ref 4.0–10.5)
nRBC: 0 % (ref 0.0–0.2)

## 2021-08-23 LAB — URINALYSIS, ROUTINE W REFLEX MICROSCOPIC
Bilirubin Urine: NEGATIVE
Glucose, UA: NEGATIVE mg/dL
Hgb urine dipstick: NEGATIVE
Ketones, ur: NEGATIVE mg/dL
Leukocytes,Ua: NEGATIVE
Nitrite: NEGATIVE
Protein, ur: NEGATIVE mg/dL
Specific Gravity, Urine: 1.008 (ref 1.005–1.030)
pH: 7 (ref 5.0–8.0)

## 2021-08-23 LAB — BASIC METABOLIC PANEL
Anion gap: 9 (ref 5–15)
BUN: 16 mg/dL (ref 6–20)
CO2: 22 mmol/L (ref 22–32)
Calcium: 9.2 mg/dL (ref 8.9–10.3)
Chloride: 106 mmol/L (ref 98–111)
Creatinine, Ser: 0.96 mg/dL (ref 0.61–1.24)
GFR, Estimated: >60 mL/min (ref >60)
Glucose, Bld: 104 mg/dL — ABNORMAL HIGH (ref 70–99)
Potassium: 4.8 mmol/L (ref 3.5–5.1)
Sodium: 137 mmol/L (ref 135–145)

## 2021-08-23 MED ORDER — IBUPROFEN 600 MG PO TABS: 600.0000 mg | ORAL_TABLET | Freq: Four times a day (QID) | ORAL | 0 refills | Status: AC | PRN

## 2021-08-23 MED ORDER — MORPHINE SULFATE (PF) 4 MG/ML IV SOLN
4.0000 mg | Freq: Once | INTRAVENOUS | Status: DC
  Filled 2021-08-23: qty 1

## 2021-08-23 MED ORDER — ONDANSETRON 4 MG PO TBDP
4.0000 mg | ORAL_TABLET | Freq: Once | ORAL | Status: DC
  Filled 2021-08-23: qty 1

## 2021-08-23 MED ORDER — MORPHINE SULFATE (PF) 4 MG/ML IV SOLN: 4.0000 mg | Freq: Once | INTRAVENOUS | Status: DC

## 2021-08-23 MED ORDER — HYDROCODONE-ACETAMINOPHEN 5-325 MG PO TABS
1.0000 | ORAL_TABLET | ORAL | 0 refills | Status: AC | PRN
Start: 2021-08-23 — End: ?

## 2021-08-23 MED ORDER — METHOCARBAMOL 500 MG PO TABS: 500.0000 mg | ORAL_TABLET | Freq: Two times a day (BID) | ORAL | 0 refills | Status: AC

## 2021-08-23 MED ORDER — ONDANSETRON HCL 4 MG/2ML IJ SOLN
4.0000 mg | Freq: Once | INTRAMUSCULAR | Status: DC
  Filled 2021-08-23: qty 2

## 2021-08-23 MED ORDER — IBUPROFEN 800 MG PO TABS
800.0000 mg | ORAL_TABLET | Freq: Once | ORAL | Status: AC
  Administered 2021-08-23: 800 mg via ORAL
  Filled 2021-08-23: qty 1
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
## 2021-11-07 ENCOUNTER — Other Ambulatory Visit: Payer: Self-pay

## 2021-11-07 MED ORDER — AMLODIPINE BESYLATE 10 MG PO TABS
ORAL_TABLET | Freq: Every day | ORAL | 2 refills | Status: AC
Start: 2021-11-07 — End: ?
  Filled 2021-11-07: qty 30, 30d supply, fill #0
  Filled 2021-12-08: qty 30, 30d supply, fill #1
  Filled 2022-01-05: qty 30, 30d supply, fill #2
  Filled 2022-02-07: qty 30, 30d supply, fill #3
  Filled 2022-03-30: qty 30, 30d supply, fill #4
  Filled 2022-05-02: qty 30, 30d supply, fill #5

## 2021-11-07 MED ORDER — HYDROCHLOROTHIAZIDE 12.5 MG PO TABS
ORAL_TABLET | Freq: Every day | ORAL | 2 refills | Status: AC
  Filled 2021-11-07: qty 30, 30d supply, fill #0
  Filled 2021-12-08: qty 30, 30d supply, fill #1
  Filled 2022-01-05: qty 30, 30d supply, fill #2
  Filled 2022-02-07: qty 30, 30d supply, fill #3
  Filled 2022-03-30: qty 30, 30d supply, fill #4
  Filled 2022-05-02: qty 30, 30d supply, fill #5

## 2021-11-07 MED ORDER — MELOXICAM 15 MG PO TABS
ORAL_TABLET | ORAL | 2 refills | Status: AC
  Filled 2021-11-07: qty 30, 30d supply, fill #0
  Filled 2021-12-08: qty 30, 30d supply, fill #1
  Filled 2022-01-05: qty 30, 30d supply, fill #2
  Filled 2022-02-07: qty 30, 30d supply, fill #3
  Filled 2022-03-30 – 2022-04-09 (×2): qty 30, 30d supply, fill #4

## 2021-11-07 MED ORDER — OMEPRAZOLE 40 MG PO CPDR
DELAYED_RELEASE_CAPSULE | ORAL | 2 refills | Status: DC
  Filled 2021-11-07: qty 30, 30d supply, fill #0
  Filled 2021-12-08: qty 30, 30d supply, fill #1
  Filled 2022-01-05 (×3): qty 30, 30d supply, fill #2

## 2021-11-16 ENCOUNTER — Other Ambulatory Visit: Payer: Self-pay

## 2021-11-16 MED ORDER — PRAMIPEXOLE DIHYDROCHLORIDE 0.125 MG PO TABS
ORAL_TABLET | ORAL | 2 refills | Status: AC
  Filled 2021-11-16: qty 30, 30d supply, fill #0
  Filled 2021-12-08 – 2022-02-07 (×3): qty 30, 30d supply, fill #1
  Filled 2022-03-30: qty 30, 30d supply, fill #2
  Filled 2022-05-02: qty 30, 30d supply, fill #3

## 2021-11-17 ENCOUNTER — Other Ambulatory Visit: Payer: Self-pay

## 2021-12-08 ENCOUNTER — Other Ambulatory Visit: Payer: Self-pay

## 2021-12-11 ENCOUNTER — Other Ambulatory Visit: Payer: Self-pay

## 2021-12-18 ENCOUNTER — Other Ambulatory Visit: Payer: Self-pay

## 2022-01-05 ENCOUNTER — Other Ambulatory Visit: Payer: Self-pay

## 2022-02-07 ENCOUNTER — Other Ambulatory Visit: Payer: Self-pay

## 2022-02-07 MED ORDER — OMEPRAZOLE 40 MG PO CPDR
40.0000 mg | DELAYED_RELEASE_CAPSULE | Freq: Every morning | ORAL | 2 refills | Status: AC
  Filled 2022-02-07: qty 30, 30d supply, fill #0
  Filled 2022-03-07: qty 30, 30d supply, fill #1
  Filled 2022-04-09: qty 30, 30d supply, fill #2

## 2022-02-08 ENCOUNTER — Other Ambulatory Visit: Payer: Self-pay

## 2022-03-07 ENCOUNTER — Other Ambulatory Visit: Payer: Self-pay

## 2022-03-08 ENCOUNTER — Other Ambulatory Visit: Payer: Self-pay

## 2022-03-30 ENCOUNTER — Other Ambulatory Visit: Payer: Self-pay

## 2022-03-30 MED ORDER — OMEPRAZOLE 40 MG PO CPDR
40.0000 mg | DELAYED_RELEASE_CAPSULE | Freq: Every morning | ORAL | 2 refills | Status: AC
  Filled 2022-05-02: qty 30, 30d supply, fill #0
  Filled 2022-05-30: qty 30, 30d supply, fill #1
  Filled 2022-11-15: qty 30, 30d supply, fill #2

## 2022-04-09 ENCOUNTER — Other Ambulatory Visit: Payer: Self-pay

## 2022-05-02 ENCOUNTER — Other Ambulatory Visit: Payer: Self-pay

## 2022-05-03 ENCOUNTER — Other Ambulatory Visit: Payer: Self-pay

## 2022-05-30 ENCOUNTER — Other Ambulatory Visit: Payer: Self-pay

## 2022-08-31 ENCOUNTER — Other Ambulatory Visit: Payer: Self-pay | Admitting: Internal Medicine

## 2022-08-31 ENCOUNTER — Other Ambulatory Visit: Payer: Self-pay

## 2022-08-31 MED ORDER — OMEPRAZOLE 40 MG PO CPDR
40.0000 mg | DELAYED_RELEASE_CAPSULE | Freq: Every morning | ORAL | 2 refills | Status: AC
  Filled 2022-08-31: qty 30, 30d supply, fill #0
  Filled 2022-09-28 (×2): qty 30, 30d supply, fill #1
  Filled 2022-12-18: qty 30, 30d supply, fill #2

## 2022-08-31 MED ORDER — AMLODIPINE BESYLATE 10 MG PO TABS
10.0000 mg | ORAL_TABLET | Freq: Every day | ORAL | 2 refills | Status: AC
  Filled 2022-08-31: qty 90, 90d supply, fill #0
  Filled 2022-12-04: qty 90, 90d supply, fill #1

## 2022-08-31 MED ORDER — HYDROCHLOROTHIAZIDE 12.5 MG PO TABS
12.5000 mg | ORAL_TABLET | Freq: Every day | ORAL | 2 refills | Status: AC
  Filled 2022-08-31: qty 90, 90d supply, fill #0
  Filled 2022-12-04 – 2022-12-18 (×2): qty 90, 90d supply, fill #1

## 2022-09-01 LAB — COMPLETE METABOLIC PANEL WITH GFR
AG Ratio: 1.5 (calc) (ref 1.0–2.5)
ALT: 21 U/L (ref 9–46)
AST: 23 U/L (ref 10–35)
Albumin: 4.6 g/dL (ref 3.6–5.1)
Alkaline phosphatase (APISO): 75 U/L (ref 35–144)
BUN: 16 mg/dL (ref 7–25)
CO2: 23 mmol/L (ref 20–32)
Calcium: 9.6 mg/dL (ref 8.6–10.3)
Chloride: 104 mmol/L (ref 98–110)
Creat: 0.95 mg/dL (ref 0.70–1.35)
Globulin: 3 g/dL (calc) (ref 1.9–3.7)
Glucose, Bld: 73 mg/dL (ref 65–99)
Potassium: 4.2 mmol/L (ref 3.5–5.3)
Sodium: 139 mmol/L (ref 135–146)
Total Bilirubin: 0.6 mg/dL (ref 0.2–1.2)
Total Protein: 7.6 g/dL (ref 6.1–8.1)
eGFR: 91 mL/min/{1.73_m2} (ref >=60)

## 2022-09-01 LAB — LIPID PANEL
Cholesterol: 262 mg/dL — ABNORMAL HIGH (ref <200)
HDL: 87 mg/dL (ref >=40)
LDL Cholesterol (Calc): 156 mg/dL (calc) — ABNORMAL HIGH
Non-HDL Cholesterol (Calc): 175 mg/dL (calc) — ABNORMAL HIGH (ref <130)
Total CHOL/HDL Ratio: 3 (calc) (ref <5.0)
Triglycerides: 89 mg/dL (ref <150)

## 2022-09-01 LAB — CBC
HCT: 42 % (ref 38.5–50.0)
Hemoglobin: 14.2 g/dL (ref 13.2–17.1)
MCH: 29.3 pg (ref 27.0–33.0)
MCHC: 33.8 g/dL (ref 32.0–36.0)
MCV: 86.6 fL (ref 80.0–100.0)
MPV: 10.1 fL (ref 7.5–12.5)
Platelets: 299 10*3/uL (ref 140–400)
RBC: 4.85 10*6/uL (ref 4.20–5.80)
RDW: 12.8 % (ref 11.0–15.0)
WBC: 3.3 10*3/uL — ABNORMAL LOW (ref 3.8–10.8)

## 2022-09-01 LAB — PSA: PSA: 0.82 ng/mL (ref <=4.00)

## 2022-09-01 LAB — TSH: TSH: 0.8 mIU/L (ref 0.40–4.50)

## 2022-09-01 LAB — VITAMIN D 25 HYDROXY (VIT D DEFICIENCY, FRACTURES): Vit D, 25-Hydroxy: 24 ng/mL — ABNORMAL LOW (ref 30–100)

## 2022-09-07 ENCOUNTER — Encounter (HOSPITAL_COMMUNITY): Payer: Commercial Managed Care - PPO

## 2022-09-10 ENCOUNTER — Other Ambulatory Visit (HOSPITAL_COMMUNITY): Payer: Self-pay | Admitting: Internal Medicine

## 2022-09-10 ENCOUNTER — Ambulatory Visit (HOSPITAL_COMMUNITY)
Admission: RE | Admit: 2022-09-10 | Discharge: 2022-09-10 | Disposition: A | Discharge status: Home / Self Care | Payer: BC Managed Care – PPO | Source: Ambulatory Visit | Attending: Vascular Surgery | Admitting: Vascular Surgery

## 2022-09-10 DIAGNOSIS — I739 Peripheral vascular disease, unspecified: Secondary | ICD-10-CM

## 2022-09-10 LAB — VAS US ABI WITH/WO TBI
Left ABI: 1.11
Right ABI: 1.21

## 2022-09-28 ENCOUNTER — Other Ambulatory Visit: Payer: Self-pay

## 2022-11-02 ENCOUNTER — Other Ambulatory Visit: Payer: Self-pay

## 2022-11-02 MED ORDER — TRIAMCINOLONE ACETONIDE 0.1 % EX CREA
1.0000 | TOPICAL_CREAM | Freq: Two times a day (BID) | CUTANEOUS | 2 refills | Status: AC | PRN
  Filled 2022-11-02: qty 454, 30d supply, fill #0

## 2022-11-02 MED ORDER — PREDNISONE 20 MG PO TABS
ORAL_TABLET | ORAL | 0 refills | Status: AC
  Filled 2022-11-02: qty 10, 7d supply, fill #0

## 2022-11-15 ENCOUNTER — Other Ambulatory Visit: Payer: Self-pay

## 2022-12-04 ENCOUNTER — Other Ambulatory Visit: Payer: Self-pay

## 2022-12-11 ENCOUNTER — Other Ambulatory Visit: Payer: Self-pay

## 2022-12-18 ENCOUNTER — Other Ambulatory Visit: Payer: Self-pay
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
## 2023-06-03 ENCOUNTER — Other Ambulatory Visit: Payer: Self-pay

## 2023-06-03 MED ORDER — AZITHROMYCIN 250 MG PO TABS
ORAL_TABLET | ORAL | 0 refills | Status: AC
  Filled 2023-06-03: qty 6, 5d supply, fill #0

## 2023-06-03 MED ORDER — HYDROCHLOROTHIAZIDE 25 MG PO TABS
25.0000 mg | ORAL_TABLET | Freq: Every day | ORAL | 2 refills | Status: DC
  Filled 2023-06-03: qty 90, 90d supply, fill #0
  Filled 2023-09-09: qty 90, 90d supply, fill #1
  Filled 2023-12-16: qty 90, 90d supply, fill #2

## 2023-06-03 MED ORDER — AMLODIPINE BESYLATE 10 MG PO TABS
10.0000 mg | ORAL_TABLET | Freq: Every day | ORAL | 2 refills | Status: DC
  Filled 2023-06-03: qty 90, 90d supply, fill #0
  Filled 2023-09-09: qty 90, 90d supply, fill #1
  Filled 2023-12-16: qty 90, 90d supply, fill #2

## 2023-06-03 MED ORDER — OMEPRAZOLE 40 MG PO CPDR
40.0000 mg | DELAYED_RELEASE_CAPSULE | Freq: Every morning | ORAL | 2 refills | Status: DC
  Filled 2023-06-03: qty 30, 30d supply, fill #0
  Filled 2023-07-29: qty 30, 30d supply, fill #1
  Filled 2023-08-30: qty 30, 30d supply, fill #2

## 2023-06-03 MED ORDER — CETIRIZINE HCL 10 MG PO TABS
10.0000 mg | ORAL_TABLET | Freq: Every evening | ORAL | 5 refills | Status: AC
  Filled 2023-06-03 – 2023-08-16 (×2): qty 30, 30d supply, fill #0
  Filled 2023-12-22: qty 30, 30d supply, fill #1

## 2023-06-05 ENCOUNTER — Other Ambulatory Visit: Payer: Self-pay

## 2023-06-05 MED ORDER — VITAMIN D (ERGOCALCIFEROL) 50000 UNITS PO CAPS
1250.0000 ug | ORAL_CAPSULE | ORAL | 5 refills | Status: AC
  Filled 2023-06-05 – 2023-06-18 (×2): qty 4, 28d supply, fill #0
  Filled 2023-10-31: qty 4, 28d supply, fill #1
  Filled 2023-11-27: qty 4, 28d supply, fill #2
  Filled 2023-12-22 – 2024-01-13 (×2): qty 4, 28d supply, fill #3

## 2023-06-14 ENCOUNTER — Other Ambulatory Visit: Payer: Self-pay

## 2023-06-18 ENCOUNTER — Other Ambulatory Visit: Payer: Self-pay

## 2023-07-18 ENCOUNTER — Encounter (HOSPITAL_COMMUNITY): Payer: Self-pay | Admitting: *Deleted

## 2023-07-18 ENCOUNTER — Other Ambulatory Visit (HOSPITAL_COMMUNITY): Payer: Self-pay

## 2023-07-18 ENCOUNTER — Other Ambulatory Visit: Payer: Self-pay

## 2023-07-18 ENCOUNTER — Ambulatory Visit (HOSPITAL_COMMUNITY)
Admission: EM | Admit: 2023-07-18 | Discharge: 2023-07-18 | Disposition: A | Discharge status: Home / Self Care | Payer: BC Managed Care – PPO | Attending: Physician Assistant | Admitting: Physician Assistant

## 2023-07-18 DIAGNOSIS — J069 Acute upper respiratory infection, unspecified: Secondary | ICD-10-CM

## 2023-07-18 MED ORDER — PREDNISONE 20 MG PO TABS
40.0000 mg | ORAL_TABLET | Freq: Every day | ORAL | 0 refills | Status: AC
  Filled 2023-07-18 (×2): qty 10, 5d supply, fill #0

## 2023-07-18 NOTE — ED Triage Notes (Signed)
 Pt states that he has had cough, congestion, fever and back pain from coughing since last Friday. He states he is taking no meds for the sx.

## 2023-07-18 NOTE — Discharge Instructions (Addendum)
 Based on your described symptoms and the duration of symptoms it is likely that you have a viral upper respiratory infection (often called a "cold")  Symptoms can last for 3-10 days with lingering cough and intermittent symptoms lasting weeks after that.  The goal of treatment at this time is to reduce your symptoms and discomfort   I recommend using Robitussin and Mucinex (regular formulations, nothing with decongestants or DM)  You can also use Tylenol for body aches and fever reduction I also recommend adding an antihistamine to your daily regimen This includes medications like Claritin, Allegra, Zyrtec- the generics of these work very well and are usually less expensive I recommend using Flonase nasal spray - 2 puffs twice per day to help with your nasal congestion The antihistamines and Flonase can take a few weeks to provide significant relief from allergy symptoms but should start to provide some benefit soon. You can use a humidifier at night to help with preventing nasal dryness and irritation   I have sent in a script for Prednisone burst to be taken in the morning with breakfast per the instructions on the container Remember that steroids can cause sleeplessness, irritability, increased hunger and elevated glucose levels so be mindful of these side effects. They should lessen as you progress to the lower doses of the taper.   Go to the ER if you begin to have more serious symptoms such as shortness of breath, trouble breathing, loss of consciousness, swelling around the eyes, high fever, severe lasting headaches, vision changes or neck pain/stiffness.

## 2023-07-18 NOTE — ED Provider Notes (Signed)
 [facility]    CSN: [id number] Arrival date & time: 07/18/23  1123      History   Chief Complaint Chief Complaint  Patient presents with   Cough   Fever   Back Pain   Nasal Congestion    HPI William Watson is a 63 y.o. male.   HPI  He reports having cough, fever, back pain since last Friday and states symptoms became worse on Sunday  He reports cough is productive He states fever was high yesterday - unsure of tmax  He has not been taking anything for his symptoms  He feels like he is having chest tightness and feels like his back is hurting more especially from coughing    Past Medical History:  Diagnosis Date   GERD (gastroesophageal reflux disease)    Hepatitis B    Hyperlipidemia    Hypertension     Patient Active Problem List   Diagnosis Date Noted   GERD (gastroesophageal reflux disease) 08/24/2016   Hepatitis B 08/24/2016    History reviewed. No pertinent surgical history.     Home Medications    Prior to Admission medications   Medication Sig Start Date End Date Taking? Authorizing Provider  amLODipine (NORVASC) 10 MG tablet Take 1 tablet (10 mg total) by mouth daily. 06/03/23  Yes   hydrochlorothiazide (HYDRODIURIL) 12.5 MG tablet take 1 tablet by mouth once daily 11/07/21  Yes   lisinopril (PRINIVIL,ZESTRIL) 10 MG tablet Take 2 tablets (20 mg total) by mouth daily. 09/11/13  Yes Idol, Raynelle Fanning, PA-C  omeprazole (PRILOSEC) 40 MG capsule Take 1 capsule (40 mg total) by mouth in the morning. 06/03/23  Yes   acetaminophen (TYLENOL) 500 MG tablet Take 1,000 mg by mouth every 6 (six) hours as needed for mild pain or moderate pain.    [provider]  allopurinol (ZYLOPRIM) 100 MG tablet TK 1 T PO  QD 06/09/16   [provider]  amLODipine (NORVASC) 10 MG tablet take 1 tabley by mouth once daily 11/07/21     amLODipine (NORVASC) 10 MG tablet Take 1 tablet (10 mg total) by mouth daily. 08/31/22     azithromycin (ZITHROMAX Z-PAK) 250 MG  tablet TAKE 2 TABLETS BY MOUTH ON DAY 1, THEN 1 TABLET BY MOUTH ONCE A DAY ON DAYS 2 THROUGH 5 FOR ACUTE Bronchitis. 06/03/23     cetirizine (ZYRTEC) 10 MG tablet Take 1 tablet (10 mg total) by mouth every evening AS NEEDED FOR ALLERGIES. 06/03/23     cyclobenzaprine (FLEXERIL) 5 MG tablet TK 1 T PO QHS PRN 07/06/16   [provider]  fluconazole (DIFLUCAN) 200 MG tablet Take 2 tablets by mouth on day one then take 1 tablet daily for 13 days. 11/06/16   Armbruster, Willaim Rayas, MD  hydrochlorothiazide (HYDRODIURIL) 12.5 MG tablet Take 1 tablet (12.5 mg total) by mouth daily. 08/31/22     hydrochlorothiazide (HYDRODIURIL) 25 MG tablet Take 1 tablet (25 mg total) by mouth daily. 09/11/13   Burgess Amor, PA-C  hydrochlorothiazide (HYDRODIURIL) 25 MG tablet Take 1 tablet (25 mg total) by mouth daily. 06/03/23     HYDROcodone-acetaminophen (NORCO/VICODIN) 5-325 MG tablet Take 1 tablet by mouth every 4 (four) hours as needed. 08/23/21   Jacalyn Lefevre, MD  ibuprofen (ADVIL) 600 MG tablet Take 1 tablet (600 mg total) by mouth every 6 (six) hours as needed. 08/23/21   Jacalyn Lefevre, MD  linaclotide [facility]) 145 MCG CAPS capsule Take 1 capsule (145 mcg total) by  mouth daily before breakfast. 08/21/17   Armbruster, Willaim Rayas, MD  meclizine (ANTIVERT) 32 MG tablet Take 1 tablet (32 mg total) by mouth 3 (three) times daily as needed. 09/02/14   Hedges, Tinnie Gens, PA-C  meloxicam (MOBIC) 15 MG tablet Take 15 mg by mouth daily. with food 09/29/16   [provider]  meloxicam (MOBIC) 15 MG tablet take 1 tablet by mouth once daily with food 11/07/21     methocarbamol (ROBAXIN) 500 MG tablet Take 1 tablet (500 mg total) by mouth 2 (two) times daily. 08/23/21   Jacalyn Lefevre, MD  omeprazole (PRILOSEC) 40 MG capsule Take 1 capsule (40 mg total) by mouth 2 (two) times daily. 10/01/16   Armbruster, Willaim Rayas, MD  omeprazole (PRILOSEC) 40 MG capsule Take 1 capsule (40 mg total) by mouth every morning. 02/07/22     omeprazole  (PRILOSEC) 40 MG capsule Take 1 capsule (40 mg total) by mouth in the morning. 03/30/22     omeprazole (PRILOSEC) 40 MG capsule Take 1 capsule (40 mg total) by mouth in the morning. 08/31/22     ondansetron (ZOFRAN) 4 MG tablet Take 1 tablet (4 mg total) by mouth every 6 (six) hours. 09/02/14   Hedges, Tinnie Gens, PA-C  pramipexole (MIRAPEX) 0.125 MG tablet take 1 tablet by mouth at bedtime 11/16/21     predniSONE (DELTASONE) 20 MG tablet Take 2 tablets (40 mg total) by mouth daily for 5 days. 07/18/23 07/23/23 Yes Chozen Latulippe E, PA-C  triamcinolone cream (KENALOG) 0.1 % Apply 1 Application topically 2 (two) times daily as needed. 11/02/22     valsartan-hydrochlorothiazide (DIOVAN-HCT) 80-12.5 MG per tablet Take 1 tablet by mouth daily. 08/01/14 07/18/23  [provider]  Vitamin D, Ergocalciferol, 50000 units CAPS Take one capsule (1,250 mcg) by mouth once a week. 06/04/23       Family History History reviewed. No pertinent family history.  Social History Social History   Tobacco Use   Smoking status: Never   Smokeless tobacco: Never  Vaping Use   Vaping status: Never Used  Substance Use Topics   Alcohol use: No   Drug use: No     Allergies   Patient has no known allergies.   Review of Systems Review of Systems  Constitutional:  Positive for fever. Negative for chills and fatigue.  HENT:  Positive for congestion, ear pain (R>L), sinus pressure, sinus pain and sore throat. Negative for postnasal drip.   Respiratory:  Positive for cough. Negative for shortness of breath and wheezing.   Gastrointestinal:  Negative for diarrhea, nausea and vomiting.  Musculoskeletal:  Positive for back pain.  Neurological:  Positive for headaches.     Physical Exam Triage Vital Signs ED Triage Vitals  Encounter Vitals Group     BP 07/18/23 1256 (!) 142/86     Systolic BP Percentile --      Diastolic BP Percentile --      Pulse Rate 07/18/23 1256 67     Resp 07/18/23 1256 18     Temp  07/18/23 1256 98.6 F (37 C)     Temp Source 07/18/23 1256 Oral     SpO2 07/18/23 1256 98 %     Weight --      Height --      Head Circumference --      Peak Flow --      Pain Score 07/18/23 1252 6     Pain Loc --      Pain Education --  Exclude from Growth Chart --    No data found.  Updated Vital Signs BP (!) 142/86 (BP Location: Left Arm)   Pulse 67   Temp 98.6 F (37 C) (Oral)   Resp 18   SpO2 98%   Visual Acuity Right Eye Distance:   Left Eye Distance:   Bilateral Distance:    Right Eye Near:   Left Eye Near:    Bilateral Near:     Physical Exam Vitals reviewed.  Constitutional:      General: He is awake.     Appearance: Normal appearance. He is well-developed and well-groomed.  HENT:     Head: Normocephalic and atraumatic.     Right Ear: Hearing, tympanic membrane and ear canal normal.     Left Ear: Hearing, tympanic membrane and ear canal normal.     Mouth/Throat:     Lips: Pink.     Mouth: Mucous membranes are moist.     Pharynx: Oropharynx is clear. Uvula midline. No pharyngeal swelling, oropharyngeal exudate, posterior oropharyngeal erythema, uvula swelling or postnasal drip.     Tonsils: No tonsillar exudate or tonsillar abscesses.  Eyes:     General: Lids are normal. Gaze aligned appropriately.     Extraocular Movements: Extraocular movements intact.  Cardiovascular:     Rate and Rhythm: Normal rate and regular rhythm.     Heart sounds: Normal heart sounds.  Pulmonary:     Effort: Pulmonary effort is normal.     Breath sounds: Normal breath sounds. Decreased air movement present. No decreased breath sounds, wheezing, rhonchi or rales.  Musculoskeletal:     Cervical back: Normal range of motion and neck supple.  Lymphadenopathy:     Head:     Right side of head: No submental, submandibular or preauricular adenopathy.     Left side of head: No submental, submandibular or preauricular adenopathy.     Cervical:     Right cervical: No  superficial cervical adenopathy.    Left cervical: No superficial cervical adenopathy.     Upper Body:     Right upper body: No supraclavicular adenopathy.     Left upper body: No supraclavicular adenopathy.  Skin:    General: Skin is warm and dry.  Neurological:     General: No focal deficit present.     Mental Status: He is alert and oriented to person, place, and time.  Psychiatric:        Mood and Affect: Mood normal.        Behavior: Behavior normal. Behavior is cooperative.        Thought Content: Thought content normal.        Judgment: Judgment normal.      UC Treatments / Results  Labs (all labs ordered are listed, but only abnormal results are displayed) Labs Reviewed - No data to display  EKG   Radiology No results found.  Procedures Procedures (including critical care time)  Medications Ordered in UC Medications - No data to display  Initial Impression / Assessment and Plan / UC Course  I have reviewed the triage vital signs and the nursing notes.  Pertinent labs & imaging results that were available during my care of the patient were reviewed by me and considered in my medical decision making (see chart for details).      Final Clinical Impressions(s) / UC Diagnoses   Final diagnoses:  Viral upper respiratory tract infection   Acute, new concern Patient reports that he has had productive  cough, nasal congestion, fever and back pain from coughing since last Friday.  He has not been taking any over-the-counter medications for relief.  Physical exam is notable for mildly decreased air movement globally but no wheezes, rhonchi, rales.  Vitals are reassuring today so less suspicious for pneumonia.  Will provide prednisone burst for 5 days to assist with perceived shortness of breath and back pain.  Will also assist with pulmonary inflammation and coughing.  Recommend over-the-counter medications for symptomatic management as desired.  Reviewed that starting  Robitussin, Mucinex, Tylenol will provide some relief of symptoms.  ED and return precautions reviewed and provided in after visit summary.  Follow-up as needed for progressing or persistent symptoms    Discharge Instructions      Based on your described symptoms and the duration of symptoms it is likely that you have a viral upper respiratory infection (often called a "cold")  Symptoms can last for 3-10 days with lingering cough and intermittent symptoms lasting weeks after that.  The goal of treatment at this time is to reduce your symptoms and discomfort   I recommend using Robitussin and Mucinex (regular formulations, nothing with decongestants or DM)  You can also use Tylenol for body aches and fever reduction I also recommend adding an antihistamine to your daily regimen This includes medications like Claritin, Allegra, Zyrtec- the generics of these work very well and are usually less expensive I recommend using Flonase nasal spray - 2 puffs twice per day to help with your nasal congestion The antihistamines and Flonase can take a few weeks to provide significant relief from allergy symptoms but should start to provide some benefit soon. You can use a humidifier at night to help with preventing nasal dryness and irritation   I have sent in a script for Prednisone burst to be taken in the morning with breakfast per the instructions on the container Remember that steroids can cause sleeplessness, irritability, increased hunger and elevated glucose levels so be mindful of these side effects. They should lessen as you progress to the lower doses of the taper.   If your symptoms do not improve or become worse in the next 5-7 days please make an apt at the office so we can see you  Go to the ER if you begin to have more serious symptoms such as shortness of breath, trouble breathing, loss of consciousness, swelling around the eyes, high fever, severe lasting headaches, vision changes or  neck pain/stiffness.       ED Prescriptions     Medication Sig Dispense Auth. Provider   predniSONE (DELTASONE) 20 MG tablet Take 2 tablets (40 mg total) by mouth daily for 5 days. 10 tablet Destenie Ingber E, PA-C      PDMP not reviewed this encounter.   Providence Crosby, PA-C 07/18/23 1358

## 2023-07-19 ENCOUNTER — Other Ambulatory Visit: Payer: Self-pay

## 2023-07-19 MED ORDER — CETIRIZINE HCL 10 MG PO TABS
10.0000 mg | ORAL_TABLET | Freq: Every evening | ORAL | 5 refills | Status: AC
  Filled 2023-07-19: qty 30, 30d supply, fill #0

## 2023-07-29 ENCOUNTER — Other Ambulatory Visit: Payer: Self-pay

## 2023-07-30 ENCOUNTER — Other Ambulatory Visit: Payer: Self-pay

## 2023-08-16 ENCOUNTER — Other Ambulatory Visit: Payer: Self-pay

## 2023-08-21 ENCOUNTER — Other Ambulatory Visit: Payer: Self-pay

## 2023-08-21 MED ORDER — FLUTICASONE PROPIONATE 50 MCG/ACT NA SUSP
2.0000 | Freq: Every day | NASAL | 5 refills | Status: AC
  Filled 2023-08-21: qty 16, 30d supply, fill #0
  Filled 2023-12-22: qty 16, 30d supply, fill #1

## 2023-08-21 MED ORDER — CETIRIZINE HCL 10 MG PO TABS
10.0000 mg | ORAL_TABLET | Freq: Every evening | ORAL | 5 refills | Status: AC
  Filled 2023-08-21: qty 30, 30d supply, fill #0

## 2023-08-30 ENCOUNTER — Other Ambulatory Visit: Payer: Self-pay

## 2023-09-12 ENCOUNTER — Other Ambulatory Visit: Payer: Self-pay

## 2023-09-27 ENCOUNTER — Other Ambulatory Visit: Payer: Self-pay

## 2023-09-29 MED ORDER — OMEPRAZOLE 40 MG PO CPDR
40.0000 mg | DELAYED_RELEASE_CAPSULE | Freq: Every morning | ORAL | 2 refills | Status: AC
  Filled 2023-09-29: qty 30, 30d supply, fill #0
  Filled 2023-10-31: qty 30, 30d supply, fill #1
  Filled 2023-12-22 – 2024-01-13 (×2): qty 30, 30d supply, fill #2

## 2023-09-30 ENCOUNTER — Other Ambulatory Visit: Payer: Self-pay

## 2023-10-02 ENCOUNTER — Other Ambulatory Visit: Payer: Self-pay

## 2023-10-31 ENCOUNTER — Other Ambulatory Visit: Payer: Self-pay

## 2023-11-04 ENCOUNTER — Other Ambulatory Visit: Payer: Self-pay

## 2023-11-27 ENCOUNTER — Other Ambulatory Visit: Payer: Self-pay

## 2023-11-27 MED ORDER — FAMOTIDINE 20 MG PO TABS
20.0000 mg | ORAL_TABLET | Freq: Two times a day (BID) | ORAL | 2 refills | Status: AC
  Filled 2023-11-27: qty 60, 30d supply, fill #0
  Filled 2023-12-22: qty 60, 30d supply, fill #1

## 2023-11-27 MED ORDER — FUROSEMIDE 20 MG PO TABS
20.0000 mg | ORAL_TABLET | Freq: Every day | ORAL | 2 refills | Status: DC | PRN
  Filled 2023-11-27: qty 30, 30d supply, fill #0
  Filled 2023-12-16 – 2024-01-13 (×3): qty 30, 30d supply, fill #1
  Filled 2024-02-14 (×2): qty 30, 30d supply, fill #2

## 2023-12-03 ENCOUNTER — Ambulatory Visit (INDEPENDENT_AMBULATORY_CARE_PROVIDER_SITE_OTHER): Admitting: Infectious Diseases

## 2023-12-03 ENCOUNTER — Other Ambulatory Visit: Payer: Self-pay

## 2023-12-03 ENCOUNTER — Encounter: Payer: Self-pay | Admitting: Infectious Diseases

## 2023-12-03 VITALS — BP 155/79 | HR 65 | Temp 98.2°F | Ht 70.0 in | Wt 179.0 lb

## 2023-12-03 DIAGNOSIS — Z9189 Other specified personal risk factors, not elsewhere classified: Secondary | ICD-10-CM

## 2023-12-03 DIAGNOSIS — B9681 Helicobacter pylori [H. pylori] as the cause of diseases classified elsewhere: Secondary | ICD-10-CM

## 2023-12-03 DIAGNOSIS — B181 Chronic viral hepatitis B without delta-agent: Secondary | ICD-10-CM | POA: Diagnosis not present

## 2023-12-03 DIAGNOSIS — K2970 Gastritis, unspecified, without bleeding: Secondary | ICD-10-CM | POA: Diagnosis not present

## 2023-12-03 DIAGNOSIS — Z0000 Encounter for general adult medical examination without abnormal findings: Secondary | ICD-10-CM

## 2023-12-03 DIAGNOSIS — Z114 Encounter for screening for human immunodeficiency virus [HIV]: Secondary | ICD-10-CM

## 2023-12-03 DIAGNOSIS — Z1159 Encounter for screening for other viral diseases: Secondary | ICD-10-CM

## 2023-12-03 DIAGNOSIS — Z129 Encounter for screening for malignant neoplasm, site unspecified: Secondary | ICD-10-CM | POA: Diagnosis not present

## 2023-12-03 NOTE — Progress Notes (Incomplete)
 [facility]                                      [address], [place], [place], [address]                                               Phn. [phone]; Fax: [phone]  Date: 12/03/23 Reason for Visit: Hepatitis B Initial Visit    HPI: William Watson is a 63 y.o.old male with HTN, HLD, GERD, arthritis/gouty arthritis, Prediabetes, chronic hepatitis B who is referred from PCP for evaluation and management of Hepatitis B.   He reports being diagnosed with hepatitis B in 1999 after moving to the [place]  from [place]. He reports having been monitored in the past, but treatment has not been initiated. Last seen by Dr Rollin back in 07/2019 for chronic HBV. He appears to have been treated for H pylori with quadruple regimen that time. He is unsure of the mode of transmission and has no known family history of hepatitis B or liver cancer. He has not had any sexual partners known to have hepatitis B. Reports he does not think his wife and six children have not been tested for the virus. Denies history of blood transfusion, or sharing personal items like razors or toothbrushes.   He works in a [employer] and denies smoking, alcohol, or recreational drug use.   He is doing well and has no complaints today.   ROS: Denies yellowish discoloration of sclera and skin, abdominal pain/distension, hematemesis.            Denies fever, chills, nightsweats, nausea, vomiting, diarrhea, constipation, appetite/weight loss, recent hospitalizations, rashes, joint complaints, shortness of breath, cough,  headaches, GU symptoms.  Current Outpatient Medications on File Prior to Visit  Medication Sig Dispense Refill   acetaminophen  (TYLENOL ) 500 MG tablet Take 1,000 mg by mouth every 6 (six) hours as needed for mild pain or moderate pain.     allopurinol (ZYLOPRIM) 100 MG tablet TK 1 T PO  QD     amLODipine  (NORVASC ) 10 MG tablet Take 1  tablet (10 mg total) by mouth daily. 90 tablet 2   methocarbamol  (ROBAXIN ) 500 MG tablet Take 1 tablet (500 mg total) by mouth 2 (two) times daily. 20 tablet 0   pramipexole  (MIRAPEX ) 0.125 MG tablet take 1 tablet by mouth at bedtime 90 tablet 2   Vitamin D , Ergocalciferol , 50000 units CAPS Take one capsule (1,250 mcg) by mouth once a week. 4 capsule 5   amLODipine  (NORVASC ) 10 MG tablet take 1 tabley by mouth once daily 90 tablet 2   amLODipine  (NORVASC ) 10 MG tablet Take 1 tablet (10 mg total) by mouth daily. 90 tablet 2   azithromycin  (ZITHROMAX  Z-PAK) 250 MG tablet TAKE 2 TABLETS BY MOUTH ON DAY 1, THEN 1 TABLET BY MOUTH ONCE A DAY ON DAYS 2 THROUGH 5 FOR ACUTE Bronchitis. 6 tablet 0   cetirizine  (ZYRTEC ) 10 MG tablet Take 1 tablet (10 mg total) by mouth every evening AS NEEDED FOR ALLERGIES. (Patient not taking: Reported on 12/03/2023) 30 tablet 5   cetirizine  (ZYRTEC ) 10 MG tablet Take 1 tablet (10 mg total) by mouth in the evening as needed for allergies 30 tablet 5  cetirizine  (ZYRTEC ) 10 MG tablet Take 1 tablet (10 mg total) by mouth every evening as needed for allergies 30 tablet 5   cyclobenzaprine (FLEXERIL) 5 MG tablet TK 1 T PO QHS PRN     famotidine  (PEPCID ) 20 MG tablet Take 1 tablet (20 mg total) by mouth 2 (two) times daily. 60 tablet 2   fluconazole  (DIFLUCAN ) 200 MG tablet Take 2 tablets by mouth on day one then take 1 tablet daily for 13 days. 15 tablet 0   fluticasone  (FLONASE  ALLERGY RELIEF) 50 MCG/ACT nasal spray Place 2 sprays into both nostrils daily. 16 g 5   furosemide  (LASIX ) 20 MG tablet Take 1 tablet (20 mg total) by mouth daily as needed for leg swelling. 30 tablet 2   hydrochlorothiazide  (HYDRODIURIL ) 12.5 MG tablet take 1 tablet by mouth once daily 90 tablet 2   hydrochlorothiazide  (HYDRODIURIL ) 12.5 MG tablet Take 1 tablet (12.5 mg total) by mouth daily. 90 tablet 2   hydrochlorothiazide  (HYDRODIURIL ) 25 MG tablet Take 1 tablet (25 mg total) by mouth daily. 30  tablet 0   hydrochlorothiazide  (HYDRODIURIL ) 25 MG tablet Take 1 tablet (25 mg total) by mouth daily. 90 tablet 2   HYDROcodone -acetaminophen  (NORCO/VICODIN) 5-325 MG tablet Take 1 tablet by mouth every 4 (four) hours as needed. 10 tablet 0   ibuprofen  (ADVIL ) 600 MG tablet Take 1 tablet (600 mg total) by mouth every 6 (six) hours as needed. 30 tablet 0   linaclotide  (LINZESS ) 145 MCG CAPS capsule Take 1 capsule (145 mcg total) by mouth daily before breakfast. 12 capsule 0   lisinopril  (PRINIVIL ,ZESTRIL ) 10 MG tablet Take 2 tablets (20 mg total) by mouth daily. 30 tablet 0   meclizine  (ANTIVERT ) 32 MG tablet Take 1 tablet (32 mg total) by mouth 3 (three) times daily as needed. 30 tablet 0   meloxicam  (MOBIC ) 15 MG tablet Take 15 mg by mouth daily. with food  5   meloxicam  (MOBIC ) 15 MG tablet take 1 tablet by mouth once daily with food 90 tablet 2   omeprazole  (PRILOSEC) 40 MG capsule Take 1 capsule (40 mg total) by mouth 2 (two) times daily. 90 capsule 1   omeprazole  (PRILOSEC) 40 MG capsule Take 1 capsule (40 mg total) by mouth every morning. 30 capsule 2   omeprazole  (PRILOSEC) 40 MG capsule Take 1 capsule (40 mg total) by mouth in the morning. 30 capsule 2   omeprazole  (PRILOSEC) 40 MG capsule Take 1 capsule (40 mg total) by mouth in the morning. 30 capsule 2   omeprazole  (PRILOSEC) 40 MG capsule Take 1 capsule (40 mg total) by mouth in the morning. 30 capsule 2   ondansetron  (ZOFRAN ) 4 MG tablet Take 1 tablet (4 mg total) by mouth every 6 (six) hours. 12 tablet 0   triamcinolone  cream (KENALOG ) 0.1 % Apply 1 Application topically 2 (two) times daily as needed. (Patient not taking: Reported on 12/03/2023) 454 g 2   valsartan-hydrochlorothiazide  (DIOVAN-HCT) 80-12.5 MG per tablet Take 1 tablet by mouth daily.     Current Facility-Administered Medications on File Prior to Visit  Medication Dose Route Frequency Provider Last Rate Last Admin   0.9 %  sodium chloride  infusion  500 mL Intravenous  Continuous Armbruster, Elspeth SQUIBB, MD       No Known Allergies  Past Medical History:  Diagnosis Date   GERD (gastroesophageal reflux disease)    Hepatitis B    Hyperlipidemia    Hypertension    No past surgical history on  file.  Social History   Socioeconomic History   Marital status: Married    Spouse name: Not on file   Number of children: Not on file   Years of education: Not on file   Highest education level: Not on file  Occupational History   Not on file  Tobacco Use   Smoking status: Never   Smokeless tobacco: Never  Vaping Use   Vaping status: Never Used  Substance and Sexual Activity   Alcohol use: No   Drug use: No   Sexual activity: Not Currently  Other Topics Concern   Not on file  Social History Narrative   Not on file   Social Drivers of Health   Financial Resource Strain: Not on file  Food Insecurity: Not on file  Transportation Needs: Not on file  Physical Activity: Not on file  Stress: Not on file  Social Connections: Not on file  Intimate Partner Violence: Not on file   No family history on file.  Vitals  BP (!) 155/79   Pulse 65   Temp 98.2 F (36.8 C) (Temporal)   Ht 5' 10 (1.778 m)   Wt 179 lb (81.2 kg)   SpO2 98%   BMI 25.68 kg/m   Exam Gen: no acute distress HEENT: [place]/AT, no scleral icterus, no pale conjunctivae, hearing normal, oral mucosa moist Neck: Supple Cardio: Regular rate and rhythm, s1s2 Resp: CTAB GI: Soft,nondistended GU: Musc: Extremities: No pedal edema Skin: No rashes Neuro: Grossly non focal, awake, alert and oriented * 3 Psych: Calm, cooperative  Laboratory     Latest Ref Rng & Units 08/31/2022   11:00 AM 08/23/2021    9:20 AM 08/24/2016    4:29 PM  CBC  WBC 3.8 - 10.8 Thousand/uL 3.3  5.2  3.9   Hemoglobin 13.2 - 17.1 g/dL 85.7  84.0  85.1   Hematocrit 38.5 - 50.0 % 42.0  50.0  44.3   Platelets 140 - 400 Thousand/uL 299  231  269.0       Latest Ref Rng & Units 12/03/2023    1:42 PM 08/31/2022    11:00 AM 08/23/2021    8:08 AM  CMP  Glucose 65 - 99 mg/dL 88  73  895   BUN 7 - 25 mg/dL 17  16  16    Creatinine 0.70 - 1.35 mg/dL 9.05  9.04  9.03   Sodium 135 - 146 mmol/L 138  139  137   Potassium 3.5 - 5.3 mmol/L 3.4  4.2  4.8   Chloride 98 - 110 mmol/L 98  104  106   CO2 20 - 32 mmol/L 29  23  22    Calcium 8.6 - 10.3 mg/dL 89.9  9.6  9.2   Total Protein 6.1 - 8.1 g/dL 7.9  7.6  6.9   Total Bilirubin 0.2 - 1.2 mg/dL 0.5  0.6  1.0   Alkaline Phos 38 - 126 U/L   93   AST 10 - 35 U/L 26  23  44   ALT 9 - 46 U/L 24  21  48    Assessment/Plan: # Chronic Hepatitis B - Discussed risk factors, modes of transmission, preventions, need for monitoring, treatment options and risk for Hepatocellular ca - labs today including US  with elastography - some labs for hepatitis B, HIV and HAV done in 2018/2019 reviewed  - fu after labs/US  scheduled date to review results and +/- tx  # H pylori gastritis - appears to have been treated  in 05/2019 with quadruple regimen, unclear if test of eradication done.   # Screening  - HIV, HCV   # Ca screening  - US  liver as above   # Need for Immunization/HCM - HAV total ab  # Preventive measures  -discussed family members including sexual partners to be tested for HBV -vaccination for household and sexual contacts if not already immune -use barrier protection during sexual intercourse if partner is HBV negative and non immune -no sharing of toothbrushes or razors or nail clippers  I spent 66  minutes involved in face-to-face and non-face-to-face activities for this patient on the day of the visit. Professional time spent includes the following activities: Preparing to see the patient (review of tests), Obtaining and reviewing separately obtained history (outside referral notes from PCP), Performing a medically appropriate examination and evaluation , Ordering labs and imaging, Documenting clinical information in the EMR, Independently interpreting  results (not separately reported), Communicating results to the patient, Counseling and educating the patient.  Patients questions were addressed and answered.   Of note, portions of this note may have been created with voice recognition software. While this note has been edited for accuracy, occasional wrong-word or 'sound-a-like' substitutions may have occurred due to the inherent limitations of voice recognition software.   Electronically signed by:  Annalee Orem, MD Infectious Diseases  Office phone [phone] Fax no. [phone]

## 2023-12-04 DIAGNOSIS — Z114 Encounter for screening for human immunodeficiency virus [HIV]: Secondary | ICD-10-CM | POA: Insufficient documentation

## 2023-12-04 DIAGNOSIS — B9681 Helicobacter pylori [H. pylori] as the cause of diseases classified elsewhere: Secondary | ICD-10-CM | POA: Insufficient documentation

## 2023-12-04 DIAGNOSIS — Z129 Encounter for screening for malignant neoplasm, site unspecified: Secondary | ICD-10-CM | POA: Insufficient documentation

## 2023-12-04 DIAGNOSIS — Z0000 Encounter for general adult medical examination without abnormal findings: Secondary | ICD-10-CM | POA: Insufficient documentation

## 2023-12-04 DIAGNOSIS — Z1159 Encounter for screening for other viral diseases: Secondary | ICD-10-CM | POA: Insufficient documentation

## 2023-12-11 LAB — COMPREHENSIVE METABOLIC PANEL WITH GFR
AG Ratio: 1.5 (calc) (ref 1.0–2.5)
ALT: 24 U/L (ref 9–46)
AST: 26 U/L (ref 10–35)
Albumin: 4.7 g/dL (ref 3.6–5.1)
Alkaline phosphatase (APISO): 72 U/L (ref 35–144)
BUN: 17 mg/dL (ref 7–25)
CO2: 29 mmol/L (ref 20–32)
Calcium: 10 mg/dL (ref 8.6–10.3)
Chloride: 98 mmol/L (ref 98–110)
Creat: 0.94 mg/dL (ref 0.70–1.35)
Globulin: 3.2 g/dL (ref 1.9–3.7)
Glucose, Bld: 88 mg/dL (ref 65–99)
Potassium: 3.4 mmol/L — ABNORMAL LOW (ref 3.5–5.3)
Sodium: 138 mmol/L (ref 135–146)
Total Bilirubin: 0.5 mg/dL (ref 0.2–1.2)
Total Protein: 7.9 g/dL (ref 6.1–8.1)
eGFR: 92 mL/min/1.73m2 (ref >=60)

## 2023-12-11 LAB — HEPATITIS B E ANTIBODY: Hep B E Ab: REACTIVE — AB

## 2023-12-11 LAB — LIVER FIBROSIS, FIBROTEST-ACTITEST
ALT: 23 U/L (ref 9–46)
Alpha-2-Macroglobulin: 228 mg/dL (ref 106–279)
Apolipoprotein A1: 243 mg/dL — ABNORMAL HIGH (ref 94–176)
Bilirubin: 0.5 mg/dL (ref 0.2–1.2)
Fibrosis Score: 0.12
GGT: 40 U/L (ref 3–70)
Haptoglobin: 209 mg/dL (ref 43–212)
Necroinflammat ACT Score: 0.08
Reference ID: 5598153

## 2023-12-11 LAB — HEPATITIS A ANTIBODY, TOTAL: Hepatitis A AB,Total: REACTIVE — AB

## 2023-12-11 LAB — HEPATITIS B DNA, ULTRAQUANTITATIVE, PCR
Hepatitis B DNA: 310 [IU]/mL — ABNORMAL HIGH
Hepatitis B virus DNA: 2.49 {Log_IU}/mL — ABNORMAL HIGH

## 2023-12-11 LAB — HEPATITIS C ANTIBODY: Hepatitis C Ab: NONREACTIVE

## 2023-12-11 LAB — HEPATITIS B SURFACE ANTIBODY,QUALITATIVE: Hep B S Ab: NONREACTIVE

## 2023-12-11 LAB — HEPATITIS B SURFACE ANTIGEN: Hepatitis B Surface Ag: REACTIVE — AB

## 2023-12-11 LAB — HEPATITIS B CORE ANTIBODY, TOTAL: Hep B Core Total Ab: REACTIVE — AB

## 2023-12-11 LAB — HEPATITIS B E ANTIGEN: Hep B E Ag: NONREACTIVE

## 2023-12-11 LAB — HIV ANTIBODY (ROUTINE TESTING W REFLEX): HIV 1&2 Ab, 4th Generation: NONREACTIVE

## 2023-12-11 LAB — HEPATITIS DELTA ANTIBODY: Hepatitis D Ab, Total: NEGATIVE

## 2023-12-12 ENCOUNTER — Ambulatory Visit (HOSPITAL_COMMUNITY)
Admission: RE | Admit: 2023-12-12 | Discharge: 2023-12-12 | Disposition: A | Discharge status: Home / Self Care | Source: Ambulatory Visit | Attending: Infectious Diseases | Admitting: Infectious Diseases

## 2023-12-12 DIAGNOSIS — B181 Chronic viral hepatitis B without delta-agent: Secondary | ICD-10-CM | POA: Diagnosis not present

## 2023-12-12 DIAGNOSIS — Z944 Liver transplant status: Secondary | ICD-10-CM | POA: Diagnosis not present

## 2023-12-12 DIAGNOSIS — K709 Alcoholic liver disease, unspecified: Secondary | ICD-10-CM | POA: Diagnosis not present

## 2023-12-12 DIAGNOSIS — R932 Abnormal findings on diagnostic imaging of liver and biliary tract: Secondary | ICD-10-CM | POA: Diagnosis not present

## 2023-12-14 ENCOUNTER — Ambulatory Visit: Payer: Self-pay | Admitting: Infectious Diseases

## 2023-12-16 ENCOUNTER — Other Ambulatory Visit: Payer: Self-pay

## 2023-12-17 ENCOUNTER — Other Ambulatory Visit: Payer: Self-pay

## 2023-12-17 ENCOUNTER — Ambulatory Visit: Admitting: Infectious Diseases

## 2023-12-17 ENCOUNTER — Encounter: Payer: Self-pay | Admitting: Infectious Diseases

## 2023-12-17 VITALS — BP 150/83 | HR 64 | Temp 97.9°F | Wt 177.0 lb

## 2023-12-17 DIAGNOSIS — Z712 Person consulting for explanation of examination or test findings: Secondary | ICD-10-CM | POA: Diagnosis not present

## 2023-12-17 DIAGNOSIS — B181 Chronic viral hepatitis B without delta-agent: Secondary | ICD-10-CM | POA: Diagnosis not present

## 2023-12-17 DIAGNOSIS — K2970 Gastritis, unspecified, without bleeding: Secondary | ICD-10-CM

## 2023-12-17 DIAGNOSIS — Z129 Encounter for screening for malignant neoplasm, site unspecified: Secondary | ICD-10-CM

## 2023-12-17 DIAGNOSIS — B9681 Helicobacter pylori [H. pylori] as the cause of diseases classified elsewhere: Secondary | ICD-10-CM | POA: Diagnosis not present

## 2023-12-17 NOTE — Progress Notes (Signed)
 [facility]                                      [address], [place], [place], [address]                                               Phn. [phone]; Fax: [phone]  Date: 12/17/23 Reason for Visit: Hepatitis B fu/review of labs   HPI: William Watson is a 63 y.o.old male with HTN, HLD, GERD, arthritis/gouty arthritis, Prediabetes, chronic hepatitis B who is referred from PCP for evaluation and management of Hepatitis B.   He reports being diagnosed with hepatitis B in 1999 after moving to the [place]  from [place]. He reports having been monitored in the past, but treatment has not been initiated. Last seen by Dr Rollin back in 07/2019 for chronic HBV. He appears to have been treated for H pylori with quadruple regimen that time. He is unsure of the mode of transmission and has no known family history of hepatitis B or liver cancer. He has not had any sexual partners known to have hepatitis B. Reports he does not think his wife and six children have not been tested for the virus. Denies history of blood transfusion, or sharing personal items like razors or toothbrushes.   He works in a [employer] and denies smoking, alcohol, or recreational drug use.   He is doing well and has no complaints today.   7/29 Discussed lab results from 7/15. No changes since last visit except some heart burn symptoms. Discussed to fu with Dr Rollin who has treated him in the past for  H pylori infection. Discussed need for US  liver every 6 months for [facility] screening. No complaints otherwise.   ROS: all systems reviewed and negative.   Current Outpatient Medications on File Prior to Visit  Medication Sig Dispense Refill   acetaminophen  (TYLENOL ) 500 MG tablet Take 1,000 mg by mouth every 6 (six) hours as needed for mild pain or moderate pain.     allopurinol (ZYLOPRIM) 100 MG tablet TK 1 T PO  QD     amLODipine  (NORVASC ) 10 MG  tablet take 1 tabley by mouth once daily 90 tablet 2   amLODipine  (NORVASC ) 10 MG tablet Take 1 tablet (10 mg total) by mouth daily. 90 tablet 2   amLODipine  (NORVASC ) 10 MG tablet Take 1 tablet (10 mg total) by mouth daily. 90 tablet 2   azithromycin  (ZITHROMAX  Z-PAK) 250 MG tablet TAKE 2 TABLETS BY MOUTH ON DAY 1, THEN 1 TABLET BY MOUTH ONCE A DAY ON DAYS 2 THROUGH 5 FOR ACUTE Bronchitis. 6 tablet 0   cetirizine  (ZYRTEC ) 10 MG tablet Take 1 tablet (10 mg total) by mouth every evening AS NEEDED FOR ALLERGIES. (Patient not taking: Reported on 12/03/2023) 30 tablet 5   cetirizine  (ZYRTEC ) 10 MG tablet Take 1 tablet (10 mg total) by mouth in the evening as needed for allergies 30 tablet 5   cetirizine  (ZYRTEC ) 10 MG tablet Take 1 tablet (10 mg total) by mouth every evening as needed for allergies 30 tablet 5   cyclobenzaprine (FLEXERIL) 5 MG tablet TK 1 T PO QHS PRN     famotidine  (PEPCID ) 20 MG tablet Take 1 tablet (  20 mg total) by mouth 2 (two) times daily. 60 tablet 2   fluconazole  (DIFLUCAN ) 200 MG tablet Take 2 tablets by mouth on day one then take 1 tablet daily for 13 days. 15 tablet 0   fluticasone  (FLONASE  ALLERGY RELIEF) 50 MCG/ACT nasal spray Place 2 sprays into both nostrils daily. 16 g 5   furosemide  (LASIX ) 20 MG tablet Take 1 tablet (20 mg total) by mouth daily as needed for leg swelling. 30 tablet 2   hydrochlorothiazide  (HYDRODIURIL ) 12.5 MG tablet take 1 tablet by mouth once daily 90 tablet 2   hydrochlorothiazide  (HYDRODIURIL ) 12.5 MG tablet Take 1 tablet (12.5 mg total) by mouth daily. 90 tablet 2   hydrochlorothiazide  (HYDRODIURIL ) 25 MG tablet Take 1 tablet (25 mg total) by mouth daily. 30 tablet 0   hydrochlorothiazide  (HYDRODIURIL ) 25 MG tablet Take 1 tablet (25 mg total) by mouth daily. 90 tablet 2   HYDROcodone -acetaminophen  (NORCO/VICODIN) 5-325 MG tablet Take 1 tablet by mouth every 4 (four) hours as needed. 10 tablet 0   ibuprofen  (ADVIL ) 600 MG tablet Take 1 tablet (600 mg  total) by mouth every 6 (six) hours as needed. 30 tablet 0   linaclotide  (LINZESS ) 145 MCG CAPS capsule Take 1 capsule (145 mcg total) by mouth daily before breakfast. 12 capsule 0   lisinopril  (PRINIVIL ,ZESTRIL ) 10 MG tablet Take 2 tablets (20 mg total) by mouth daily. 30 tablet 0   meclizine  (ANTIVERT ) 32 MG tablet Take 1 tablet (32 mg total) by mouth 3 (three) times daily as needed. 30 tablet 0   meloxicam  (MOBIC ) 15 MG tablet Take 15 mg by mouth daily. with food  5   meloxicam  (MOBIC ) 15 MG tablet take 1 tablet by mouth once daily with food 90 tablet 2   methocarbamol  (ROBAXIN ) 500 MG tablet Take 1 tablet (500 mg total) by mouth 2 (two) times daily. 20 tablet 0   omeprazole  (PRILOSEC) 40 MG capsule Take 1 capsule (40 mg total) by mouth 2 (two) times daily. 90 capsule 1   omeprazole  (PRILOSEC) 40 MG capsule Take 1 capsule (40 mg total) by mouth every morning. 30 capsule 2   omeprazole  (PRILOSEC) 40 MG capsule Take 1 capsule (40 mg total) by mouth in the morning. 30 capsule 2   omeprazole  (PRILOSEC) 40 MG capsule Take 1 capsule (40 mg total) by mouth in the morning. 30 capsule 2   omeprazole  (PRILOSEC) 40 MG capsule Take 1 capsule (40 mg total) by mouth in the morning. 30 capsule 2   ondansetron  (ZOFRAN ) 4 MG tablet Take 1 tablet (4 mg total) by mouth every 6 (six) hours. 12 tablet 0   pramipexole  (MIRAPEX ) 0.125 MG tablet take 1 tablet by mouth at bedtime 90 tablet 2   triamcinolone  cream (KENALOG ) 0.1 % Apply 1 Application topically 2 (two) times daily as needed. (Patient not taking: Reported on 12/03/2023) 454 g 2   valsartan-hydrochlorothiazide  (DIOVAN-HCT) 80-12.5 MG per tablet Take 1 tablet by mouth daily.     Vitamin D , Ergocalciferol , 50000 units CAPS Take one capsule (1,250 mcg) by mouth once a week. 4 capsule 5   Current Facility-Administered Medications on File Prior to Visit  Medication Dose Route Frequency Provider Last Rate Last Admin   0.9 %  sodium chloride  infusion  500 mL  Intravenous Continuous Armbruster, Elspeth SQUIBB, MD       No Known Allergies  Past Medical History:  Diagnosis Date   GERD (gastroesophageal reflux disease)    Hepatitis B  Hyperlipidemia    Hypertension    No past surgical history on file.  Social History   Socioeconomic History   Marital status: Married    Spouse name: Not on file   Number of children: Not on file   Years of education: Not on file   Highest education level: Not on file  Occupational History   Not on file  Tobacco Use   Smoking status: Never   Smokeless tobacco: Never  Vaping Use   Vaping status: Never Used  Substance and Sexual Activity   Alcohol use: No   Drug use: No   Sexual activity: Not Currently  Other Topics Concern   Not on file  Social History Narrative   Not on file   Social Drivers of Health   Financial Resource Strain: Not on file  Food Insecurity: Not on file  Transportation Needs: Not on file  Physical Activity: Not on file  Stress: Not on file  Social Connections: Not on file  Intimate Partner Violence: Not on file   No family history on file.  Vitals  BP (!) 150/83   Pulse 64   Temp 97.9 F (36.6 C) (Oral)   Wt 177 lb (80.3 kg)   SpO2 96%   BMI 25.40 kg/m   Exam Gen: no acute distress HEENT: [place]/AT, no scleral icterus, no pale conjunctivae, hearing normal, oral mucosa moist Neck: Supple Cardio: Regular rate and rhythm Resp: Pulmonary effort normal in room air  GI: nondistended GU: Musc: Extremities: No pedal edema Skin: No rashes Neuro: Grossly non focal, awake, alert and oriented * 3 Psych: Calm, cooperative  Laboratory     Latest Ref Rng & Units 08/31/2022   11:00 AM 08/23/2021    9:20 AM 08/24/2016    4:29 PM  CBC  WBC 3.8 - 10.8 Thousand/uL 3.3  5.2  3.9   Hemoglobin 13.2 - 17.1 g/dL 85.7  84.0  85.1   Hematocrit 38.5 - 50.0 % 42.0  50.0  44.3   Platelets 140 - 400 Thousand/uL 299  231  269.0       Latest Ref Rng & Units 12/03/2023    1:42 PM  08/31/2022   11:00 AM 08/23/2021    8:08 AM  CMP  Glucose 65 - 99 mg/dL 88  73  895   BUN 7 - 25 mg/dL 17  16  16    Creatinine 0.70 - 1.35 mg/dL 9.05  9.04  9.03   Sodium 135 - 146 mmol/L 138  139  137   Potassium 3.5 - 5.3 mmol/L 3.4  4.2  4.8   Chloride 98 - 110 mmol/L 98  104  106   CO2 20 - 32 mmol/L 29  23  22    Calcium 8.6 - 10.3 mg/dL 89.9  9.6  9.2   Total Protein 6.1 - 8.1 g/dL 7.9  7.6  6.9   Total Bilirubin 0.2 - 1.2 mg/dL 0.5  0.6  1.0   Alkaline Phos 38 - 126 U/L   93   AST 10 - 35 U/L 26  23  44   ALT 9 - 46 U/L 9 - 46 U/L 24    23  21   48    Imaging 12/13/23 US  abdomen w elastography ULTRASOUND ABDOMEN:  Heterogeneous echogenic appearing liver. No splenomegaly. Some limitation due to overlapping bowel gas and soft tissue.   ULTRASOUND HEPATIC ELASTOGRAPHY:  Median kPa:  4.2  Diagnostic category:  < or = 5 kPa: high probability of being  normal  Assessment/Plan: # Chronic Hepatitis B - 7/15 labs reviewed at length with normal ALT, HBV DNA down to 310. US  liver with no findings of cirrhosis or concerning lesions  - fu in 3 months   # H pylori gastritis/GERD - H pylori appears to have been treated in 05/2019 with quadruple regimen per [facility]verywhere, unclear if test of eradication done. He has some heartburn and recommended to fu with Dr Rollin for further management as well as test for eradication of H pylori if not previously done.   # HCC screening  - Risk factor: male age greater than 29, origin from [place] - Needs US  liver every 6 months   # HCM - Immune to HAV  # Preventive measures have been discussed  -discussed family members including sexual partners to be tested for HBV -vaccination for household and sexual contacts if not already immune -use barrier protection during sexual intercourse if partner is HBV negative and non immune -no sharing of toothbrushes or razors or nail clippers  I spent 20 minutes involved in face-to-face and non-face-to-face  activities for this patient on the day of the visit. Professional time spent includes the following activities: Preparing to see the patient (review of tests), Performing a medically appropriate examination and evaluation, Documenting clinical information in the EMR, Independently interpreting results (not separately reported), Communicating results to the patient, Counseling and educating the patient.  Patients questions were addressed and answered.   Of note, portions of this note may have been created with voice recognition software. While this note has been edited for accuracy, occasional wrong-word or 'sound-a-like' substitutions may have occurred due to the inherent limitations of voice recognition software.   Electronically signed by:  Annalee Orem, MD Infectious Diseases  Office phone [phone] Fax no. [phone]

## 2023-12-23 ENCOUNTER — Other Ambulatory Visit: Payer: Self-pay

## 2023-12-31 ENCOUNTER — Other Ambulatory Visit: Payer: Self-pay

## 2024-01-01 ENCOUNTER — Other Ambulatory Visit: Payer: Self-pay

## 2024-01-02 ENCOUNTER — Other Ambulatory Visit: Payer: Self-pay

## 2024-01-13 ENCOUNTER — Other Ambulatory Visit: Payer: Self-pay

## 2024-02-14 ENCOUNTER — Other Ambulatory Visit: Payer: Self-pay

## 2024-02-17 ENCOUNTER — Other Ambulatory Visit: Payer: Self-pay

## 2024-03-19 ENCOUNTER — Ambulatory Visit: Admitting: Infectious Diseases

## 2024-03-24 ENCOUNTER — Other Ambulatory Visit: Payer: Self-pay

## 2024-03-24 MED ORDER — HYDROCHLOROTHIAZIDE 25 MG PO TABS
25.0000 mg | ORAL_TABLET | Freq: Every day | ORAL | 1 refills | Status: AC
  Filled 2024-03-24: qty 90, 90d supply, fill #0

## 2024-03-24 MED ORDER — AMLODIPINE BESYLATE 10 MG PO TABS
10.0000 mg | ORAL_TABLET | Freq: Every day | ORAL | 1 refills | Status: AC
  Filled 2024-03-24: qty 90, 90d supply, fill #0

## 2024-03-27 ENCOUNTER — Other Ambulatory Visit: Payer: Self-pay

## 2024-03-27 MED ORDER — FUROSEMIDE 20 MG PO TABS
20.0000 mg | ORAL_TABLET | Freq: Every day | ORAL | 5 refills | Status: AC
  Filled 2024-03-27: qty 30, 30d supply, fill #0

## 2024-04-01 ENCOUNTER — Other Ambulatory Visit: Payer: Self-pay
